# Patient Record
Sex: Female | Born: 1935 | Race: White | Hispanic: No | Smoking: Former smoker
Health system: Southern US, Community
[De-identification: ages and names within clinical notes are randomized; demographics above are authoritative.]

## PROBLEM LIST (undated history)

## (undated) DIAGNOSIS — I10 Essential (primary) hypertension: Secondary | ICD-10-CM

## (undated) DIAGNOSIS — F09 Unspecified mental disorder due to known physiological condition: Secondary | ICD-10-CM

## (undated) DIAGNOSIS — I714 Abdominal aortic aneurysm, without rupture: Secondary | ICD-10-CM

## (undated) DIAGNOSIS — F419 Anxiety disorder, unspecified: Secondary | ICD-10-CM

## (undated) DIAGNOSIS — M792 Neuralgia and neuritis, unspecified: Secondary | ICD-10-CM

## (undated) DIAGNOSIS — N390 Urinary tract infection, site not specified: Secondary | ICD-10-CM

## (undated) DIAGNOSIS — G500 Trigeminal neuralgia: Secondary | ICD-10-CM

## (undated) DIAGNOSIS — E039 Hypothyroidism, unspecified: Secondary | ICD-10-CM

## (undated) DIAGNOSIS — F1020 Alcohol dependence, uncomplicated: Secondary | ICD-10-CM

## (undated) HISTORY — DX: Hypothyroidism, unspecified: E03.9

## (undated) HISTORY — DX: Essential (primary) hypertension: I10

## (undated) HISTORY — PX: CATARACT EXTRACTION: SUR2

## (undated) HISTORY — PX: COLONOSCOPY: SHX174

## (undated) HISTORY — PX: ABDOMINAL ANGIOGRAM: SHX5705

## (undated) HISTORY — DX: Abdominal aortic aneurysm, without rupture: I71.4

## (undated) HISTORY — PX: ABDOMINAL HYSTERECTOMY: SHX81

## (undated) HISTORY — PX: BRAIN SURGERY: SHX531

## (undated) HISTORY — DX: Trigeminal neuralgia: G50.0

---

## 1998-02-08 ENCOUNTER — Other Ambulatory Visit: Admission: RE | Admit: 1998-02-08 | Discharge: 1998-02-08 | Payer: Self-pay | Admitting: *Deleted

## 1999-01-07 ENCOUNTER — Other Ambulatory Visit: Admission: RE | Admit: 1999-01-07 | Discharge: 1999-01-07 | Payer: Self-pay | Admitting: *Deleted

## 1999-11-30 ENCOUNTER — Encounter: Payer: Self-pay | Admitting: *Deleted

## 1999-11-30 ENCOUNTER — Encounter: Admission: RE | Admit: 1999-11-30 | Discharge: 1999-11-30 | Payer: Self-pay | Admitting: *Deleted

## 2000-01-16 ENCOUNTER — Other Ambulatory Visit: Admission: RE | Admit: 2000-01-16 | Discharge: 2000-01-16 | Payer: Self-pay | Admitting: *Deleted

## 2000-11-14 ENCOUNTER — Encounter: Admission: RE | Admit: 2000-11-14 | Discharge: 2000-11-14 | Payer: Self-pay

## 2000-11-23 ENCOUNTER — Encounter: Payer: Self-pay | Admitting: Vascular Surgery

## 2000-11-23 ENCOUNTER — Encounter: Admission: RE | Admit: 2000-11-23 | Discharge: 2000-11-23 | Payer: Self-pay | Admitting: Vascular Surgery

## 2000-11-30 ENCOUNTER — Encounter: Payer: Self-pay | Admitting: Vascular Surgery

## 2000-12-05 ENCOUNTER — Ambulatory Visit (HOSPITAL_COMMUNITY): Admission: RE | Admit: 2000-12-05 | Discharge: 2000-12-05 | Payer: Self-pay | Admitting: Vascular Surgery

## 2000-12-14 ENCOUNTER — Encounter: Payer: Self-pay | Admitting: Vascular Surgery

## 2000-12-14 ENCOUNTER — Inpatient Hospital Stay (HOSPITAL_COMMUNITY): Admission: RE | Admit: 2000-12-14 | Discharge: 2000-12-17 | Payer: Self-pay | Admitting: Vascular Surgery

## 2000-12-14 HISTORY — PX: ABDOMINAL AORTIC ANEURYSM REPAIR: SUR1152

## 2001-01-10 ENCOUNTER — Encounter: Payer: Self-pay | Admitting: Vascular Surgery

## 2001-01-10 ENCOUNTER — Encounter: Admission: RE | Admit: 2001-01-10 | Discharge: 2001-01-10 | Payer: Self-pay | Admitting: Vascular Surgery

## 2001-07-11 ENCOUNTER — Encounter: Payer: Self-pay | Admitting: Vascular Surgery

## 2001-07-11 ENCOUNTER — Encounter: Admission: RE | Admit: 2001-07-11 | Discharge: 2001-07-11 | Payer: Self-pay

## 2001-07-11 ENCOUNTER — Encounter: Payer: Self-pay | Admitting: *Deleted

## 2001-07-11 ENCOUNTER — Encounter: Admission: RE | Admit: 2001-07-11 | Discharge: 2001-07-11 | Payer: Self-pay | Admitting: Vascular Surgery

## 2001-12-12 ENCOUNTER — Encounter: Payer: Self-pay | Admitting: Vascular Surgery

## 2001-12-12 ENCOUNTER — Encounter: Admission: RE | Admit: 2001-12-12 | Discharge: 2001-12-12 | Payer: Self-pay | Admitting: Vascular Surgery

## 2002-07-21 ENCOUNTER — Encounter: Admission: RE | Admit: 2002-07-21 | Discharge: 2002-07-21 | Payer: Self-pay

## 2002-12-18 ENCOUNTER — Encounter: Payer: Self-pay | Admitting: Vascular Surgery

## 2002-12-18 ENCOUNTER — Encounter: Admission: RE | Admit: 2002-12-18 | Discharge: 2002-12-18 | Payer: Self-pay | Admitting: Vascular Surgery

## 2002-12-24 ENCOUNTER — Encounter: Payer: Self-pay | Admitting: Obstetrics and Gynecology

## 2002-12-24 ENCOUNTER — Encounter: Admission: RE | Admit: 2002-12-24 | Discharge: 2002-12-24 | Payer: Self-pay | Admitting: Obstetrics and Gynecology

## 2003-03-20 ENCOUNTER — Emergency Department (HOSPITAL_COMMUNITY): Admission: EM | Admit: 2003-03-20 | Discharge: 2003-03-21 | Payer: Self-pay | Admitting: Emergency Medicine

## 2003-05-28 ENCOUNTER — Encounter: Admission: RE | Admit: 2003-05-28 | Discharge: 2003-05-28 | Payer: Self-pay

## 2003-08-28 ENCOUNTER — Encounter: Admission: RE | Admit: 2003-08-28 | Discharge: 2003-08-28 | Payer: Self-pay

## 2003-12-24 ENCOUNTER — Encounter: Admission: RE | Admit: 2003-12-24 | Discharge: 2003-12-24 | Payer: Self-pay | Admitting: Vascular Surgery

## 2004-03-12 ENCOUNTER — Inpatient Hospital Stay (HOSPITAL_COMMUNITY): Admission: EM | Admit: 2004-03-12 | Discharge: 2004-03-17 | Payer: Self-pay | Admitting: Emergency Medicine

## 2004-03-16 ENCOUNTER — Encounter (INDEPENDENT_AMBULATORY_CARE_PROVIDER_SITE_OTHER): Payer: Self-pay | Admitting: *Deleted

## 2004-12-29 ENCOUNTER — Encounter: Admission: RE | Admit: 2004-12-29 | Discharge: 2004-12-29 | Payer: Self-pay | Admitting: Vascular Surgery

## 2005-01-18 ENCOUNTER — Ambulatory Visit (HOSPITAL_COMMUNITY): Admission: RE | Admit: 2005-01-18 | Discharge: 2005-01-18 | Payer: Self-pay

## 2005-08-02 ENCOUNTER — Ambulatory Visit (HOSPITAL_COMMUNITY): Admission: RE | Admit: 2005-08-02 | Discharge: 2005-08-02 | Payer: Self-pay | Admitting: Family Medicine

## 2005-08-17 ENCOUNTER — Ambulatory Visit (HOSPITAL_COMMUNITY): Admission: RE | Admit: 2005-08-17 | Discharge: 2005-08-17 | Payer: Self-pay | Admitting: Family Medicine

## 2005-09-20 IMAGING — CR DG CHEST 2V
2 series · 2 of 2 positions shown · non-contrast
Comparison: none

CLINICAL DATA: Cough.  Fatigue. 
 CHEST ? TWO VIEWS
 Heart size is normal.  The mediastinum is unremarkable except for calcification of the thoracic aorta.  The lungs are clear.  There is prominent epicardial fat pad on the left which is not a pathologic finding.  No effusions.  No significant bony findings.
 IMPRESSION
 No active disease. 
 *This report was called to Norgbedzi at  Dr. Rudis? office at [DATE], a.m., 05/28/03.

[view not recorded (1 of 2)]
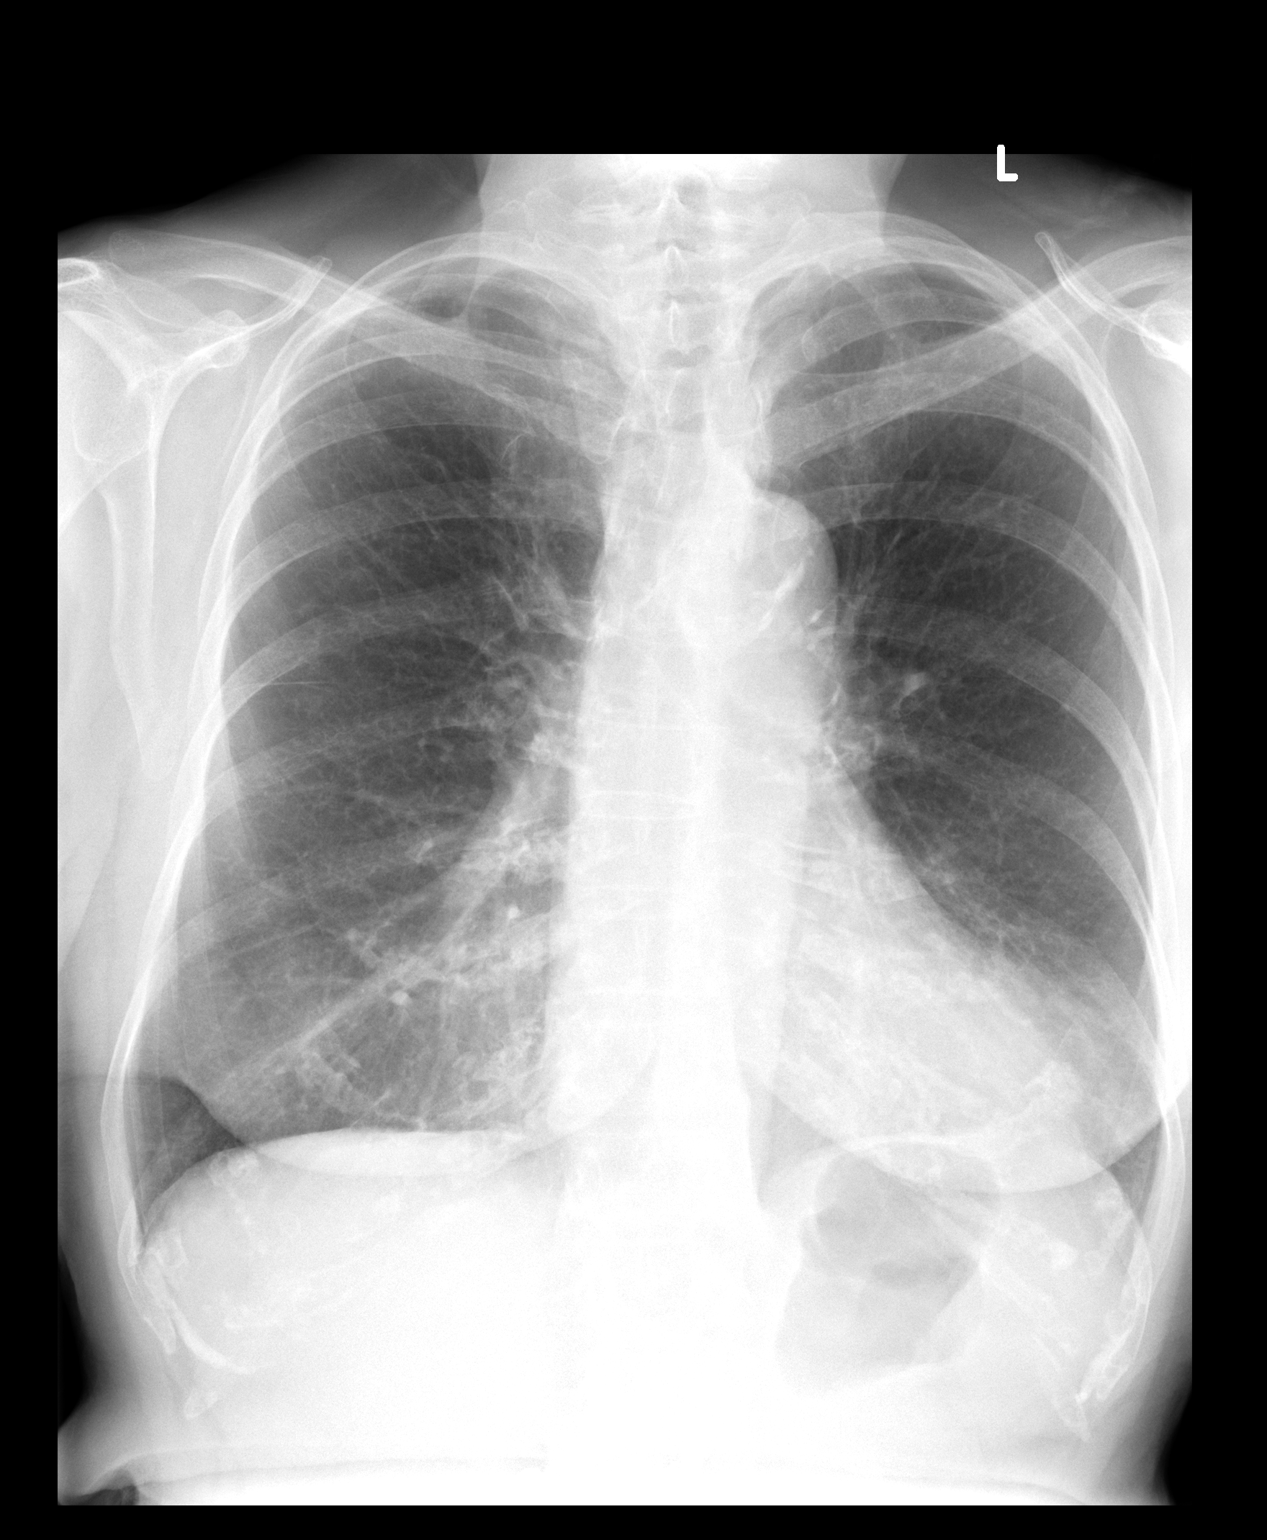

[view not recorded (2 of 2)]
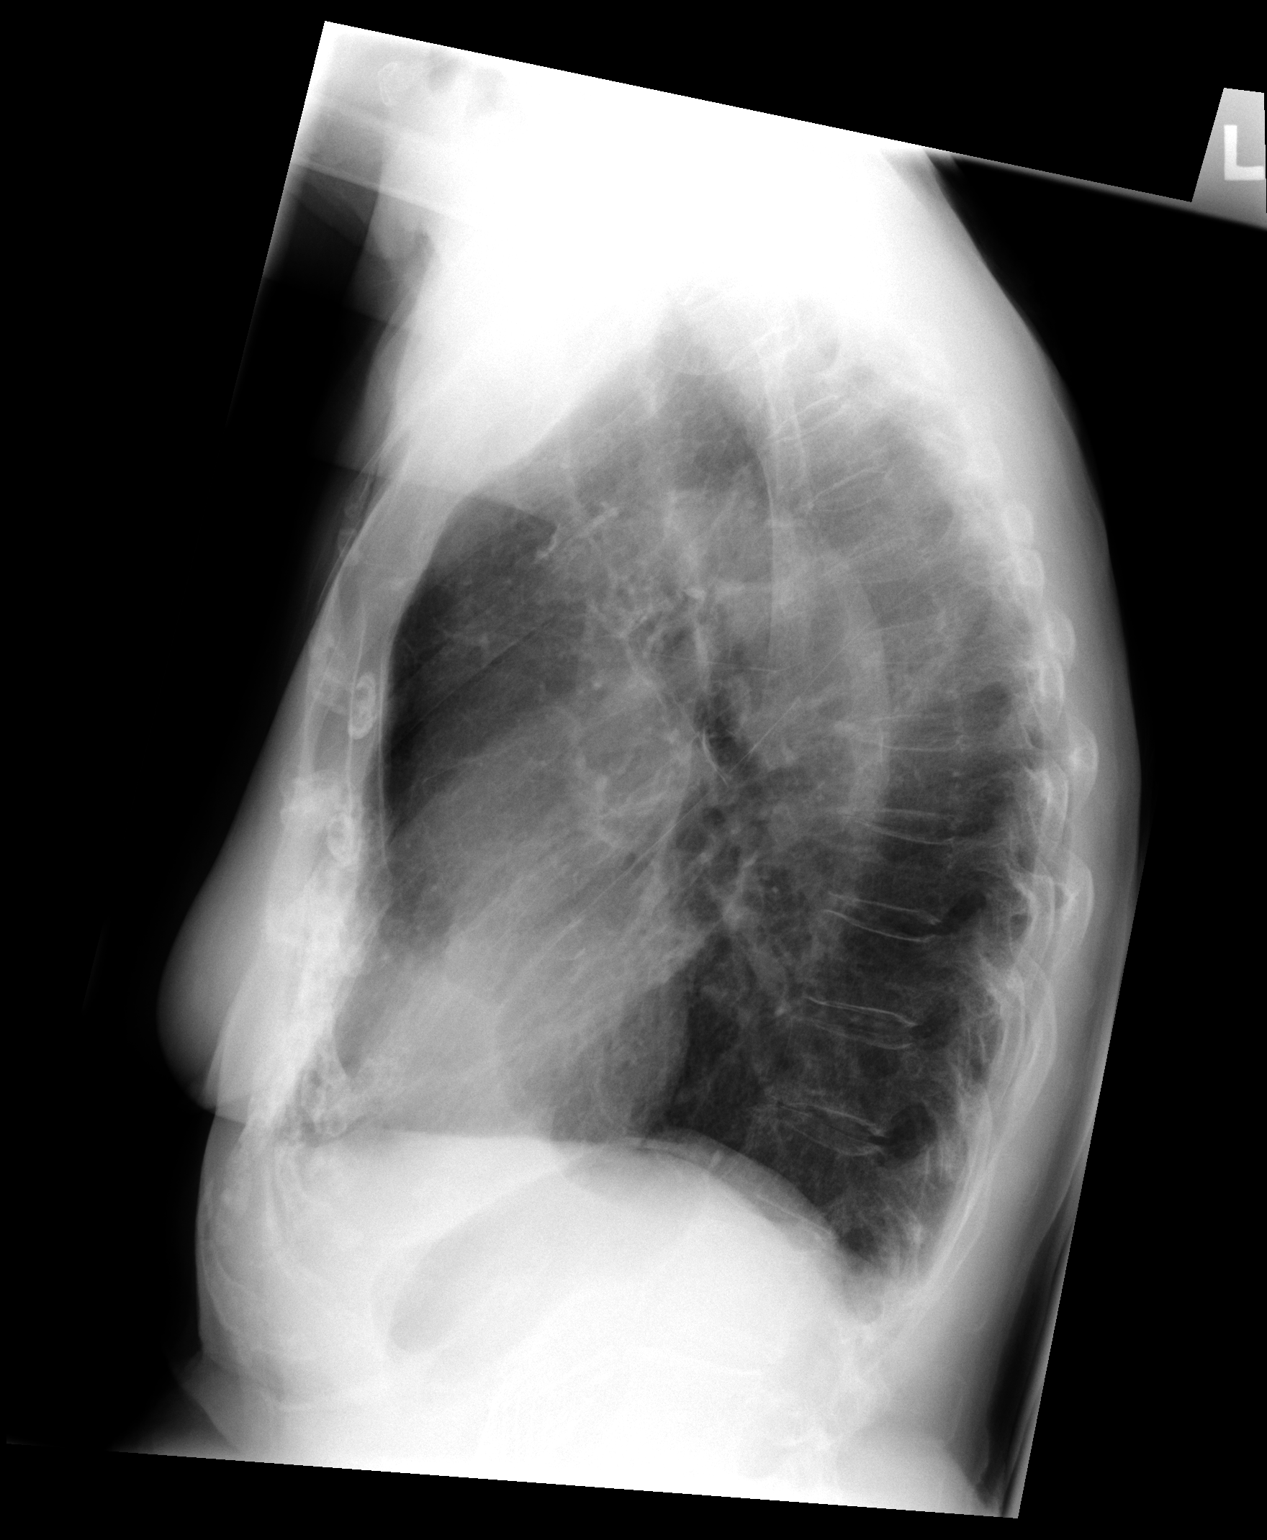

[2 of 2 positions shown; findings below may reference images not displayed]

## 2005-12-25 ENCOUNTER — Encounter: Admission: RE | Admit: 2005-12-25 | Discharge: 2005-12-25 | Payer: Self-pay | Admitting: Vascular Surgery

## 2006-03-16 ENCOUNTER — Encounter: Admission: RE | Admit: 2006-03-16 | Discharge: 2006-03-16 | Payer: Self-pay

## 2006-12-07 ENCOUNTER — Encounter: Admission: RE | Admit: 2006-12-07 | Discharge: 2006-12-07 | Payer: Self-pay | Admitting: Vascular Surgery

## 2006-12-07 ENCOUNTER — Ambulatory Visit: Payer: Self-pay | Admitting: Vascular Surgery

## 2007-06-07 ENCOUNTER — Ambulatory Visit: Payer: Self-pay | Admitting: Vascular Surgery

## 2007-06-18 ENCOUNTER — Encounter: Admission: RE | Admit: 2007-06-18 | Discharge: 2007-06-18 | Payer: Self-pay

## 2007-12-27 ENCOUNTER — Ambulatory Visit: Payer: Self-pay | Admitting: Vascular Surgery

## 2007-12-27 ENCOUNTER — Encounter: Admission: RE | Admit: 2007-12-27 | Discharge: 2007-12-27 | Payer: Self-pay | Admitting: Vascular Surgery

## 2008-06-08 ENCOUNTER — Encounter: Admission: RE | Admit: 2008-06-08 | Discharge: 2008-06-08 | Payer: Self-pay | Admitting: Family Medicine

## 2008-07-01 ENCOUNTER — Encounter: Admission: RE | Admit: 2008-07-01 | Discharge: 2008-07-01 | Payer: Self-pay | Admitting: Family Medicine

## 2008-07-31 ENCOUNTER — Encounter: Admission: RE | Admit: 2008-07-31 | Discharge: 2008-07-31 | Payer: Self-pay | Admitting: Family Medicine

## 2008-08-12 ENCOUNTER — Encounter: Admission: RE | Admit: 2008-08-12 | Discharge: 2008-08-12 | Payer: Self-pay | Admitting: Obstetrics and Gynecology

## 2008-12-21 ENCOUNTER — Encounter: Admission: RE | Admit: 2008-12-21 | Discharge: 2008-12-21 | Payer: Self-pay | Admitting: Family Medicine

## 2009-05-22 ENCOUNTER — Inpatient Hospital Stay (HOSPITAL_COMMUNITY): Admission: EM | Admit: 2009-05-22 | Discharge: 2009-05-27 | Payer: Self-pay | Admitting: Emergency Medicine

## 2009-05-24 ENCOUNTER — Encounter (INDEPENDENT_AMBULATORY_CARE_PROVIDER_SITE_OTHER): Payer: Self-pay | Admitting: Internal Medicine

## 2010-01-21 ENCOUNTER — Emergency Department (HOSPITAL_COMMUNITY): Admission: EM | Admit: 2010-01-21 | Discharge: 2010-01-21 | Payer: Self-pay | Admitting: Emergency Medicine

## 2010-02-15 ENCOUNTER — Encounter: Admission: RE | Admit: 2010-02-15 | Discharge: 2010-02-15 | Payer: Self-pay | Admitting: Vascular Surgery

## 2010-02-15 ENCOUNTER — Ambulatory Visit: Payer: Self-pay | Admitting: Vascular Surgery

## 2010-06-15 LAB — DIFFERENTIAL
Basophils Relative: 0 % (ref 0–1)
Lymphs Abs: 0.7 10*3/uL (ref 0.7–4.0)
Monocytes Absolute: 0.7 10*3/uL (ref 0.1–1.0)
Monocytes Relative: 7 % (ref 3–12)
Neutro Abs: 8.5 10*3/uL — ABNORMAL HIGH (ref 1.7–7.7)

## 2010-06-15 LAB — URINALYSIS, ROUTINE W REFLEX MICROSCOPIC
Glucose, UA: NEGATIVE mg/dL
Ketones, ur: NEGATIVE mg/dL
Nitrite: NEGATIVE
Specific Gravity, Urine: 1.009 (ref 1.005–1.030)
pH: 6.5 (ref 5.0–8.0)

## 2010-06-15 LAB — CBC
Platelets: 174 10*3/uL (ref 150–400)
RDW: 13 % (ref 11.5–15.5)
WBC: 10.1 10*3/uL (ref 4.0–10.5)

## 2010-06-15 LAB — COMPREHENSIVE METABOLIC PANEL
ALT: 17 U/L (ref 0–35)
Albumin: 3.5 g/dL (ref 3.5–5.2)
Alkaline Phosphatase: 69 U/L (ref 39–117)
BUN: 14 mg/dL (ref 6–23)
Calcium: 9.1 mg/dL (ref 8.4–10.5)
Potassium: 4.8 mEq/L (ref 3.5–5.1)
Sodium: 127 mEq/L — ABNORMAL LOW (ref 135–145)
Total Protein: 5.9 g/dL — ABNORMAL LOW (ref 6.0–8.3)

## 2010-06-15 LAB — URINE CULTURE
Colony Count: NO GROWTH
Culture  Setup Time: 201110211725

## 2010-06-23 LAB — DIFFERENTIAL
Basophils Absolute: 0 10*3/uL (ref 0.0–0.1)
Basophils Relative: 0 % (ref 0–1)
Eosinophils Absolute: 0.1 10*3/uL (ref 0.0–0.7)
Monocytes Relative: 10 % (ref 3–12)
Neutro Abs: 3.2 10*3/uL (ref 1.7–7.7)
Neutrophils Relative %: 65 % (ref 43–77)

## 2010-06-23 LAB — BASIC METABOLIC PANEL
BUN: 10 mg/dL (ref 6–23)
CO2: 25 mEq/L (ref 19–32)
Calcium: 9 mg/dL (ref 8.4–10.5)
Chloride: 99 mEq/L (ref 96–112)
Creatinine, Ser: 0.83 mg/dL (ref 0.4–1.2)
GFR calc Af Amer: >60 mL/min (ref >60)
Glucose, Bld: 72 mg/dL (ref 70–99)

## 2010-06-23 LAB — HEMOGLOBIN A1C: Hgb A1c MFr Bld: 5.6 % (ref 4.6–6.1)

## 2010-06-23 LAB — COMPREHENSIVE METABOLIC PANEL
ALT: 63 U/L — ABNORMAL HIGH (ref 0–35)
AST: 79 U/L — ABNORMAL HIGH (ref 0–37)
CO2: 21 mEq/L (ref 19–32)
Calcium: 9.1 mg/dL (ref 8.4–10.5)
GFR calc Af Amer: >60 mL/min (ref >60)
Sodium: 137 mEq/L (ref 135–145)
Total Protein: 6.4 g/dL (ref 6.0–8.3)

## 2010-06-23 LAB — CBC
HCT: 37.6 % (ref 36.0–46.0)
Hemoglobin: 13 g/dL (ref 12.0–15.0)
MCHC: 34.7 g/dL (ref 30.0–36.0)
MCHC: 34.8 g/dL (ref 30.0–36.0)
MCV: 101.6 fL — ABNORMAL HIGH (ref 78.0–100.0)
MCV: 102 fL — ABNORMAL HIGH (ref 78.0–100.0)
Platelets: 166 10*3/uL (ref 150–400)
Platelets: 169 10*3/uL (ref 150–400)
RDW: 13.5 % (ref 11.5–15.5)
RDW: 13.7 % (ref 11.5–15.5)

## 2010-06-23 LAB — LIPID PANEL
Cholesterol: 203 mg/dL — ABNORMAL HIGH (ref 0–200)
HDL: 106 mg/dL (ref >39)
LDL Cholesterol: 86 mg/dL (ref 0–99)
Total CHOL/HDL Ratio: 1.9 RATIO
Triglycerides: 56 mg/dL (ref <150)

## 2010-06-23 LAB — URINALYSIS, ROUTINE W REFLEX MICROSCOPIC
Bilirubin Urine: NEGATIVE
Glucose, UA: NEGATIVE mg/dL
Protein, ur: NEGATIVE mg/dL
Specific Gravity, Urine: 1.005 (ref 1.005–1.030)
Urobilinogen, UA: 0.2 mg/dL (ref 0.0–1.0)

## 2010-06-23 LAB — URINE MICROSCOPIC-ADD ON

## 2010-06-23 LAB — ETHANOL: Alcohol, Ethyl (B): 197 mg/dL — ABNORMAL HIGH (ref 0–10)

## 2010-06-23 LAB — GLUCOSE, CAPILLARY
Glucose-Capillary: 181 mg/dL — ABNORMAL HIGH (ref 70–99)
Glucose-Capillary: 78 mg/dL (ref 70–99)

## 2010-06-23 LAB — FOLATE: Folate: 7 ng/mL

## 2010-06-23 LAB — CARDIAC PANEL(CRET KIN+CKTOT+MB+TROPI)
Relative Index: INVALID (ref 0.0–2.5)
Total CK: 52 U/L (ref 7–177)
Troponin I: 0.01 ng/mL (ref 0.00–0.06)

## 2010-06-23 LAB — APTT: aPTT: 28 seconds (ref 24–37)

## 2010-06-23 LAB — TROPONIN I: Troponin I: 0.02 ng/mL (ref 0.00–0.06)

## 2010-06-23 LAB — ANA: Anti Nuclear Antibody(ANA): NEGATIVE

## 2010-06-23 LAB — PROTIME-INR: INR: 0.94 (ref 0.00–1.49)

## 2010-06-23 LAB — RPR: RPR Ser Ql: NONREACTIVE

## 2010-08-16 NOTE — Assessment & Plan Note (Signed)
OFFICE VISIT   Tamargo, Jessee C  DOB:  Jan 07, 1917                                       12/07/2006  NWGNF#:[id number]   The patient returns today for follow-up of her AneuRx Stent graft  placement in September, 2002.  She continues to do quite well and is  quite active at her age of 60.  She reports no new medical complications  and no concerns regarding her stent graft.   PHYSICAL EXAM TODAY:  Blood pressure is 143/81, pulse 89, respirations  18.  Her femoral pulses are 2+ without false aneurysm.  Her abdominal  exam reveals slightly prominent aortic pulsations.  She is quite thin  with no tenderness.   She underwent a CT scan, and this reveals excellent positioning of her  AneuRx Stent Graft with continued shrinkage of her aneurysm sac.  She  has no evidence of shifting and no evidence of endo leak.  She did have  an incidental finding of a 1-cm 1.2 soft tissue nodule in the right side  of the labia.  I discussed this with the patient and her daughter, who  reports that she has an appointment with Dr. Jackelyn Knife in November and  will address this at this time.  We will continue to see her in 1 year  with a CT scan.  She will also have an ultrasound of her stent graft in  6 months from now.  I explained that, with her having an excellent 5-  year result, after we  compare her ultrasound with her CT scan, we may go back to less frequent  CT scans and follow up with ultrasound alone.   Larina Earthly, M.D.  Electronically Signed   TFE/MEDQ  D:  12/07/2006  T:  12/10/2006  Job:  [id number]   cc:   Kizzie Furnish, M.D.  Zenaida Niece, M.D.

## 2010-08-16 NOTE — Assessment & Plan Note (Signed)
OFFICE VISIT   [name], [name] C  DOB:  21-Sep-1916                                       12/27/2007  ZOXWR#:[id number]   The patient presents today for followup of her stent graft repair of  abdominal aortic aneurysm with an Anurx graft in 2002.  She has had her  CT scan this morning and we are discussing this.  She continues to have  excellent health at the age of 73.  She is treated for elevation  hypertension.  Otherwise, no major medical difficulties.   PHYSICAL EXAM:  She is a thin white female in no acute distress.  Blood  pressure is 158/82, pulse 114, respirations 18.  Her femoral and  posterior tibial pulses are 2+ bilaterally.  Abdominal exam reveals no  aneurysm palpable.   CT scan today shows continued shrinking of her aneurysm sac to the point  where there is minimal sac around her stent graft.  The repair continues  to be excellent with no shift and no endoleak.  I am quite pleased with  her continued followup now 7 years out from stent graft repair.  We will  see her in 1 year with ultrasound of her stent graft repair, and then  follow this 2 years from now with CT scan.   Larina Earthly, M.D.  Electronically Signed   TFE/MEDQ  D:  12/27/2007  T:  12/30/2007  Job:  [id number]

## 2010-08-16 NOTE — Assessment & Plan Note (Signed)
OFFICE VISIT   Mcsweeney, Adream C  DOB:  1916-12-28                                       02/15/2010  ZOXWR#:[id number]   The patient presents today for follow-up of her abdominal aortic  aneurysm repair.  She had a stent graft repair of early on in the  experience of stent grafting on December 14, 2000.  She has done quite  well over the 9 years since this procedure.  She has 89 but is still  relatively independent although she has moved into an assisted living  facility.  Her daughter is in town and helps her on a daily basis.  She  denies any cardiac difficulty.  She does have hypertension, elevated  cholesterol.  She does report some night cramping when she walks.  She  does not smoke and does have one glass of wine per evening.   PHYSICAL EXAMINATION:  A well-developed, thin white female appearing  younger than stated age of 45.  Blood pressure is 128/72, pulse 111,  respirations 16.  She is in no acute distress.  HEENT:  Normal.  Chest:  Clear bilaterally.  Abdomen:  Soft, nontender.  No palpable masses and  no aneurysm palpable.  Cardiovascular:  Regular rate and rhythm.  Musculoskeletal:  No major deformity or cyanosis.  She does have well-  healed incisions with 2+ femoral pulses bilaterally.   She underwent CT scan today and I have reviewed this with the patient  and her daughter present,  independently reviewed the pictures.  This  does show good positioning of her stent graft with no evidence of  endoleak.  Her sac size has continued to diminished as well.  I am quite  pleased with her result and recommend continued CT scan on an every 2-  year basis.  We will see her again at that time.     Larina Earthly, M.D.  Electronically Signed   TFE/MEDQ  D:  02/15/2010  T:  02/16/2010  Job:  [id number]   cc:   Dr. [name]

## 2010-08-16 NOTE — Procedures (Signed)
ENDOVASCULAR STENT GRAFT EXAM   INDICATION:  Follow up abdominal aortic aneurysm repair.   HISTORY:  Abdominal aortic aneurysm repair 12/14/2000 with endovascular stent  graft by Dr. Arbie Cookey.                           DUPLEX EVALUATION   AAA Sac Size:                 2.85 CM AP            3.55 CM TRV  Previous Sac Size:            (CT 12/2006) 4 CM AP                 2.5  CM TRV  Evidence of an endoleak?      No                    No   Velocity Criteria:  Proximal Aorta                49 cm/sec  Proximal Stent Graft          47 cm/sec  Main Body Stent Graft-Mid     61 cm/sec  Right Limb-Proximal           66 cm/sec  Right Limb-Distal             50 cm/sec  Left Limb-Proximal            57 cm/sec  Left Limb-Distal              59 cm/sec  Patent Renal Arteries?        Yes                   Yes   IMPRESSION:  1. Residual abdominal aortic aneurysm sac size measurements appear      fairly stable from previous CT, taking into consideration different      modalities without evidence of endoleak.  2. Flow within stent graft appears within normal limit.   ___________________________________________  Larina Earthly, M.D.   AS/MEDQ  D:  06/07/2007  T:  06/07/2007  Job:  [id number]

## 2010-08-19 NOTE — Op Note (Signed)
[name] [name], [name] [name]               ACCOUNT NO.:  [id number]   MEDICAL RECORD NO.:  [id number]          PATIENT TYPE:  INP   LOCATION:  1610                         FACILITY:  [facility]   PHYSICIAN:  Petra Kuba, M.D.    DATE OF BIRTH:  1916/04/15   DATE OF PROCEDURE:  03/16/2004  DATE OF DISCHARGE:                                 OPERATIVE REPORT   PROCEDURE:  Flexible sigmoidoscopy with biopsy.   INDICATIONS:  Probable ischemic colitis, abnormal CAT scan, bloody diarrhea.   Consent was signed after risks, benefits, methods, options thoroughly  discussed multiple times this week.   MEDICINES USED:  Demerol 70, Versed 7.   PROCEDURE:  Rectal inspection is pertinent for external hemorrhoids, small.  Digital exam was negative.  The video pediatric adjustable colonoscope was  inserted to about 40 cm.  At that point, the scope began to loop, and  despite abdominal pressure, she exhibited pain with advancing, so we rolled  her on her back and with abdominal pressure, we could advance through the  proximal level of the splenic flexure.  Obvious changes of ischemic colitis  were seen from about the sigmoid/descending junctions of the splenic  flexure.  There was a tortuous turn which we could see around.  It seemed to  be normal transverse in the distance but advancing through it was difficult  due to looping, pain, and discomfort.  We elected to withdraw.  A few  scattered left-sided biopsies were obtained.  There were a few sigmoid  diverticula as well but no other abnormalities.  The prep was adequate.  There was some liquid stool that required washing and suctioning.  Anorectal  pull-through and retroflexion confirmed some small hemorrhoids.  The scope  was straightened, air was suctioned, and the scope removed.  The patient  tolerated the procedure adequately.  There was no obvious immediate  complication.   ENDOSCOPIC DIAGNOSES:  1.  Internal/external hemorrhoids.  2.  Left-sided  diverticula.  3.  Obvious ischemic changes from the sigmoid/descending junction to the      splenic flexure, status post biopsy.  4.  Stop secondary to pain, tortuosity, and looping.   PLAN:  Await pathology.  Considering rechecking her colon in the future.  Slowly advance diet.  Hopefully home soon if no delayed complications.      MEM/MEDQ  D:  03/16/2004  T:  03/16/2004  Job:  [id number]   cc:   Kizzie Furnish, M.D.  [address]  [place]  [place] [address]  Fax: [phone]

## 2010-08-19 NOTE — H&P (Signed)
NAMECARLOTTA, TELFAIR                           ACCOUNT NO.:  [id number]   MEDICAL RECORD NO.:  [id number]                   PATIENT TYPE:  OBV   LOCATION:  0106                                 FACILITY:  [facility]   PHYSICIAN:  Melissa L. Ladona Ridgel, MD               DATE OF BIRTH:  November 14, 1916   DATE OF ADMISSION:  03/20/2003  DATE OF DISCHARGE:                                HISTORY & PHYSICAL   PRIMARY CARE PHYSICIAN:  Dr. Andres Ege   CHIEF COMPLAINT:  Weakness x1 week.   HISTORY OF PRESENT ILLNESS:  Patient is an 77 year old white female who  noticed the gradual onset of generalized weakness over the last week and one-  half.  On Monday the patient went to her primary care physician with the  complaint of weakness and was found to have a sodium of 128.  She was re-  evaluated today when she returned to her primary care physician because she  was more weak than she had been on the first presentation.  She states that  she was so weak that she stayed in bed all day Wednesday.  She denied any  associated nausea, vomiting, fever, chills, or diarrhea.   REVIEW OF SYSTEMS:  Positive for cough several weeks, productive of white  sputum.  She denies any dysuria, diarrhea, or constipation.  She has no  nausea, no hematochezia or melena.  All other review of systems are  negative.   PAST MEDICAL HISTORY:  1. She has a recent murmur identified on echo.  2. Hypertension.  3. Ovarian cancer treated many years ago with cobalt therapy.   PAST SURGICAL HISTORY:  For aneurysms for which she has had stents placed.  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   MEDICATIONS:  1. Synthroid 75 mcg p.o. daily.  2. Hydrochlorothiazide 25 mg p.o. daily.  3. Multivitamin.  4. Vitamin E.  5. Vitamin C.  6. Vitamin B12.  7. Vitamin B6.  8. Folic acid.  9. Zinc.   PHYSICAL EXAMINATION:  Her blood pressure was stable  and over 100  systolically.  Respirations were 16.  She was saturating 100% on room air.  GENERAL:  This is an awake, alert and oriented x3 white female looking  younger than her stated age.  HEENT:  She is normocephalic, atraumatic.  Pupils are equal, round and  reactive to light.  Extraocular muscles are intact.  Mucous membranes are  moist.  There is a small patch of hairy tongue visible on oral examination.  NECK:  Supple.  There is no JVD.  There are no lymph nodes.  No thyromegaly.  CHEST:  Clear to auscultation.  There is no rhonchi, rales or wheezes.  CARDIOVASCULAR:  Regular rate and rhythm both of S1 and S2.  There is a 2/6  systolic ejection murmur.  ABDOMEN:  Soft, nontender, nondistended with positive bowel sounds.  There  is no hepatosplenomegaly.  EXTREMITIES:  There is no cyanosis, clubbing or edema.  NEUROLOGIC:  Cranial nerves II-XII are intact.  Power 5/5 in all  extremities.  Deep tendon reflexes are 2+ in all extremities.  She has gross  sensation.  She is nonfocal overall.   Laboratory values reveal a negative UA.  Total CK of 105 with a troponin of  less than 0.1.  Her white count is 8.0, her hemoglobin is 13.6 with a  hematocrit of 39.3, and her platelet count is 330.  Her sodium is 122, her  potassium is 4.9, her chloride is 91, and her CO2 is 27, BUN of 18 and  creatinine of 0.8, her glucose is 132.  Her AST is 35, her ALT is 35.   Her chest x-ray reveals loss of the left costophrenic angle consistent with  possible pneumonia versus atelectasis.   EKG is normal sinus rhythm, no ST-T wave changes.   ASSESSMENT/PLAN:  This is an 77 year old white female who was admitted for  weakness and found to have a decreased sodium and possible a left lower lobe  pneumonia that is mild in nature.   1. We would gently replete this patient with 75 mL per hour after the     initial bolus delivered in the emergency room.  We have started     moxifloxacin for bronchitis  versus early pneumonia.  Orthostatics will be     rechecked prior to her discharge.  2. Endocrine.  We will continue with her Synthroid.  3. Pulmonary.  There are no issues.  4. Gastrointestinal.  There are no issues.  However, she can advance to a     cardiac prudent diet at this time.                                               Melissa L. Ladona Ridgel, MD    MLT/MEDQ  D:  03/21/2003  T:  03/21/2003  Job:  [id number]

## 2010-08-19 NOTE — Discharge Summary (Signed)
NAMETYGER, OKA               ACCOUNT NO.:  [id number]   MEDICAL RECORD NO.:  [id number]          PATIENT TYPE:  INP   LOCATION:  7846                         [facility]ACILITY:  [facility]   PHYSICIAN:  Petra Kuba, M.D.    DATE OF BIRTH:  May 18, 1916   DATE OF ADMISSION:  03/12/2004  DATE OF DISCHARGE:  03/17/2004                                 DISCHARGE SUMMARY   PROBLEM LIST:  1.  Ischemic colitis, improved.  2.  History of peripheral vascular disease and abdominal aortic stents by      Dr. Arbie Cookey.  3.  History of ovarian cancer, status post total abdominal hysterectomy,      salpingo-oophorectomy.  4.  High blood pressure.  5.  Thyroid problems.   HISTORY:  The patient was admitted by my partner, Dr. Randa Evens, on the 10th  with ischemic colitis with bloody diarrhea, nausea, vomiting, fever,  abdominal pain.  CAT scan confirmed the diagnosis.  She was kept NPO and  even needed an NG tube for 24 hours.  She was started on Cipro and Flagyl.  Her white count peaked at 14 but then decreased to normal, and she only had  1 day of fever.  Her belly pain slowly improved.  On the 14th, we went ahead  and proceeded with a flexible sigmoidoscopy which confirmed the diagnosis,  was well-tolerated, and a few biopsies were obtained.  The patient's diet  was then slowly advanced, and on the 15th, she was deemed ready for  discharge.  There were no hospital complications or problems.  Her activity  was slowly advanced as well.   DISCHARGE INSTRUCTIONS:  1.  Resume home medicines.  2.  Pain management:  Not applicable.  3.  Activity:  Slowly advance as tolerated.  4.  Diet:  One week of low residue, no fried, spicy, nuts, popcorn,      __________ vegetables, salads, etc.   SPECIAL INSTRUCTIONS:  Call if increased pain, bleeding, fever, nausea,  vomiting, or other questions or problems.  Follow up with me in 1-2 weeks to  recheck symptoms.  Consider a one-time colonoscopy in the spring, and call  for biopsy results Monday or Tuesday just to confirm the diagnosis.  Also,  based on being followed by Dr. Arbie Cookey for peripheral vascular disease, I  thought it would be reasonable to see him in January but the daughter, who  is  a nurse here, may just call Dr. Arbie Cookey and go over the case and see if follow  up is truly necessary.  She does see him once a year in September.   CONDITION ON DISCHARGE:  Improved.      MEM/MEDQ  D:  03/17/2004  T:  03/17/2004  Job:  [id number]   cc:   Delories Heinz, MD  [facility]   Larina Earthly, M.D.  [address]  [place]  [place] [address]

## 2010-08-19 NOTE — H&P (Signed)
[name] [name], [name]               ACCOUNT NO.:  [id number]   MEDICAL RECORD NO.:  [id number]          PATIENT TYPE:  EMS   LOCATION:  ED                           FACILITY:  [facility]   PHYSICIAN:  James L. Malon Kindle., M.D.DATE OF BIRTH:  1916-09-23   DATE OF ADMISSION:  03/12/2004  DATE OF DISCHARGE:                                HISTORY & PHYSICAL   REASON FOR ADMISSION:  Abdominal pain and GI bleeding.   HISTORY:  An 77 year old patient from [place], mother of [name] [name], who was out shopping with [name] [name] today down in [place], felt fine, was  hungry, went to get something to eat, did not have any problems.  Later on,  she felt like she needed to have a bowel movement, went to [facility], had a  brown semi-formed loose stool, and felt weak and dizzy and nearly passed.  Had several episodes of syncope; [name] [name] caught her.  She had no bleeding at  that time.  She was taken to the hospital.  At the hospital, she was  slightly diaphoretic, and had a systolic pressure in the 90s.  It came right  up with fluids.  I believe the initial pressure was the one found by EMS  when then picked her up from the restaurant.  I have discussed with the ER  doctor.  The patient had several bloody bowel movements that were bright red  blood, cranberry-like stools in the emergency room, that were grossly guaiac  positive.  An NG tube was placed, and the aspirate was green and heme  negative.  The patient clinically feels better.  Her pressure has come back  up.  Her family requested that she be hospitalized here in [place] rather  than in [place], due to support structures, etc.   CURRENT MEDICATIONS:  1.  Altace 5 mg daily.  2.  Some other sort of blood pressure pill.  3.  Synthroid, dose unknown.  4.  Occasional aspirin.   ALLERGIES:  1.  PENICILLIN.  2.  DILANTIN.  3.  TEGRETOL.   MEDICAL HISTORY:  1.  She has a history of tic douloureux.  She has had a surgery for this at      [facility], and something called a gamma knife at [facility].  It has been      a long-term problem for her and recurs periodically.  2.  Hypertension.  3.  History of low sodium with hydrochlorothiazide.  4.  History of peripheral vascular disease.  Has had stent placed in her      abdominal aorta by Dr. Gretta Began.  Has never had an intra-abdominal      graft placed.  5.  She has a history of ovarian cancer, and underwent a total abdominal      hysterectomy and salpingo-oophorectomy in 1960.  6.  She has a history of previous appendicitis.  7.  Previous tonsillectomy.  8.  No history of heart disease.   FAMILY HISTORY:  Father died in his 62s of natural causes.  Mother died in  her 62s of multiple causes.  Had no cancer or heart disease.  Did have ulcer  disease.   SOCIAL HISTORY:  The patient is widowed and lives alone.  Daughter is Cherril Hett, who is a nurse here at [facility] [employer].  She has  family close by.  She drinks one vodka tonic a night.  She does not smoke,  and has not for 30 years, although she did smoke prior to that.   REVIEW OF SYSTEMS:  Her bowel movement is normally brown and formed.  She  had never had diverticular disease, never had a colonoscopy.  No history of  heart disease, exertional chest pain, etc.   PHYSICAL EXAMINATION:  VITAL SIGNS:  Temperature 97.2, blood pressure  133/49, pulse 72.  GENERAL:  Alert white female in no acute distress.  HEENT:  Eyes - sclerae are nonicteric.  Ears - tympanic membranes are both  seen.  There is not much cerumen in the ear canals.  There is good light  reflex.  Throat is normal.  The mucous membranes are noted to be moist.  NECK:  Supple.  No lymphadenopathy.  LUNGS:  Clear anteriorly and posteriorly.  HEART:  Regular rate and rhythm without murmurs or gallops.  ABDOMEN:  Soft, nondistended, with markedly increased bowel sounds, with  mild tenderness, left somewhat greater than the right.  No guarding,   rigidity, or rebound.  RECTAL:  Deferred.  Cranberry stools were observed by the ER doctor in  [place].   ASSESSMENT:  Acute lower gastrointestinal bleed and abdominal pain.  This  most likely is due to ischemic colitis, given the sudden onset of this and  the pain.  The patient seems to be clinically stable now.  There are no  signs of upper gastrointestinal bleed.  Diverticular bleed is possible.   PLAN:  Will admit, give IV fluids, follow her clinically, and plan on  colonoscopy in the next several days.  Will go ahead and keep her NG tube in  for now, and try to give her some laxatives to clear her out.      JLE/MEDQ  D:  03/12/2004  T:  03/13/2004  Job:  [id number]   cc:   Kizzie Furnish, M.D.  [address]  [place]  [place] [address]  Fax: [phone]

## 2010-10-02 IMAGING — CR DG FOOT COMPLETE 3+V*R*
3 series · 3 of 3 positions shown · non-contrast
Comparison: None

CLINICAL DATA: Fall with lateral foot pain.

RIGHT FOOT COMPLETE - 3+ VIEW

[view not recorded (1 of 3)]
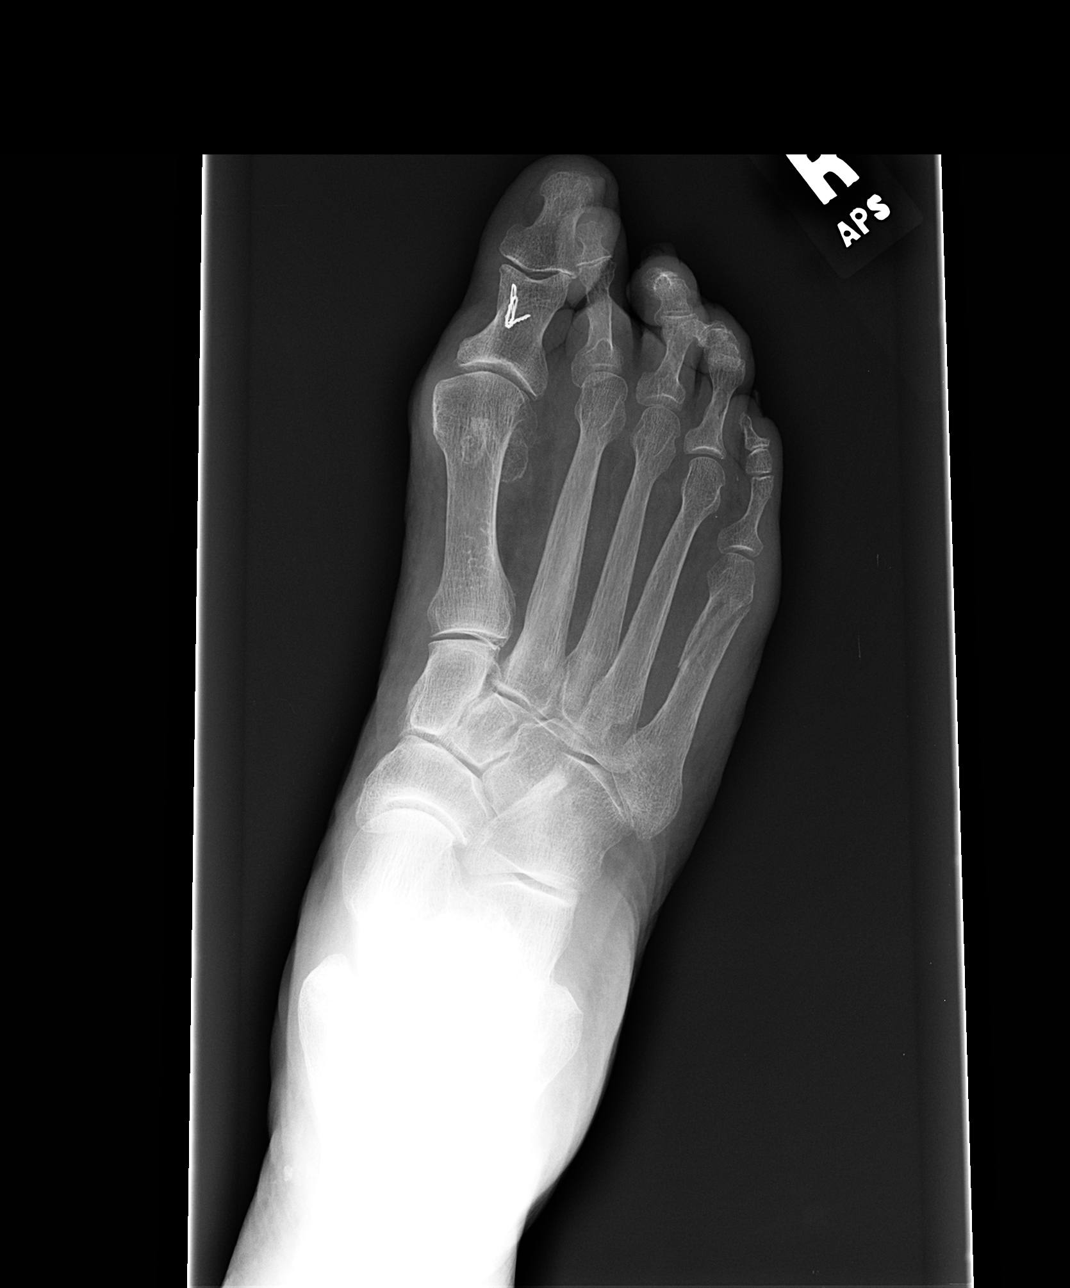

[view not recorded (2 of 3)]
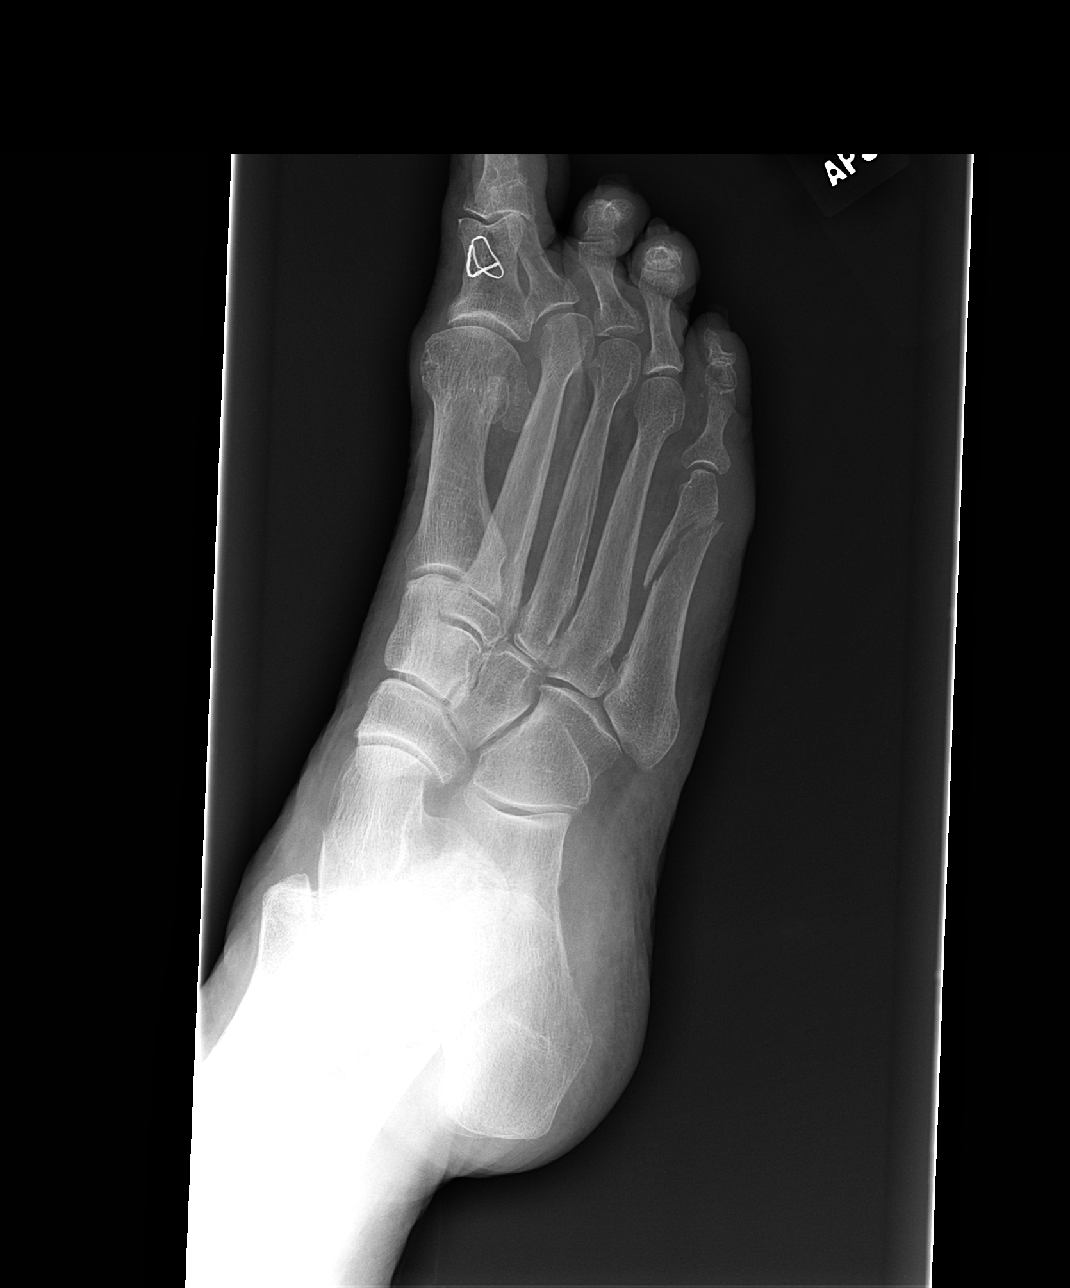

[view not recorded (3 of 3)]
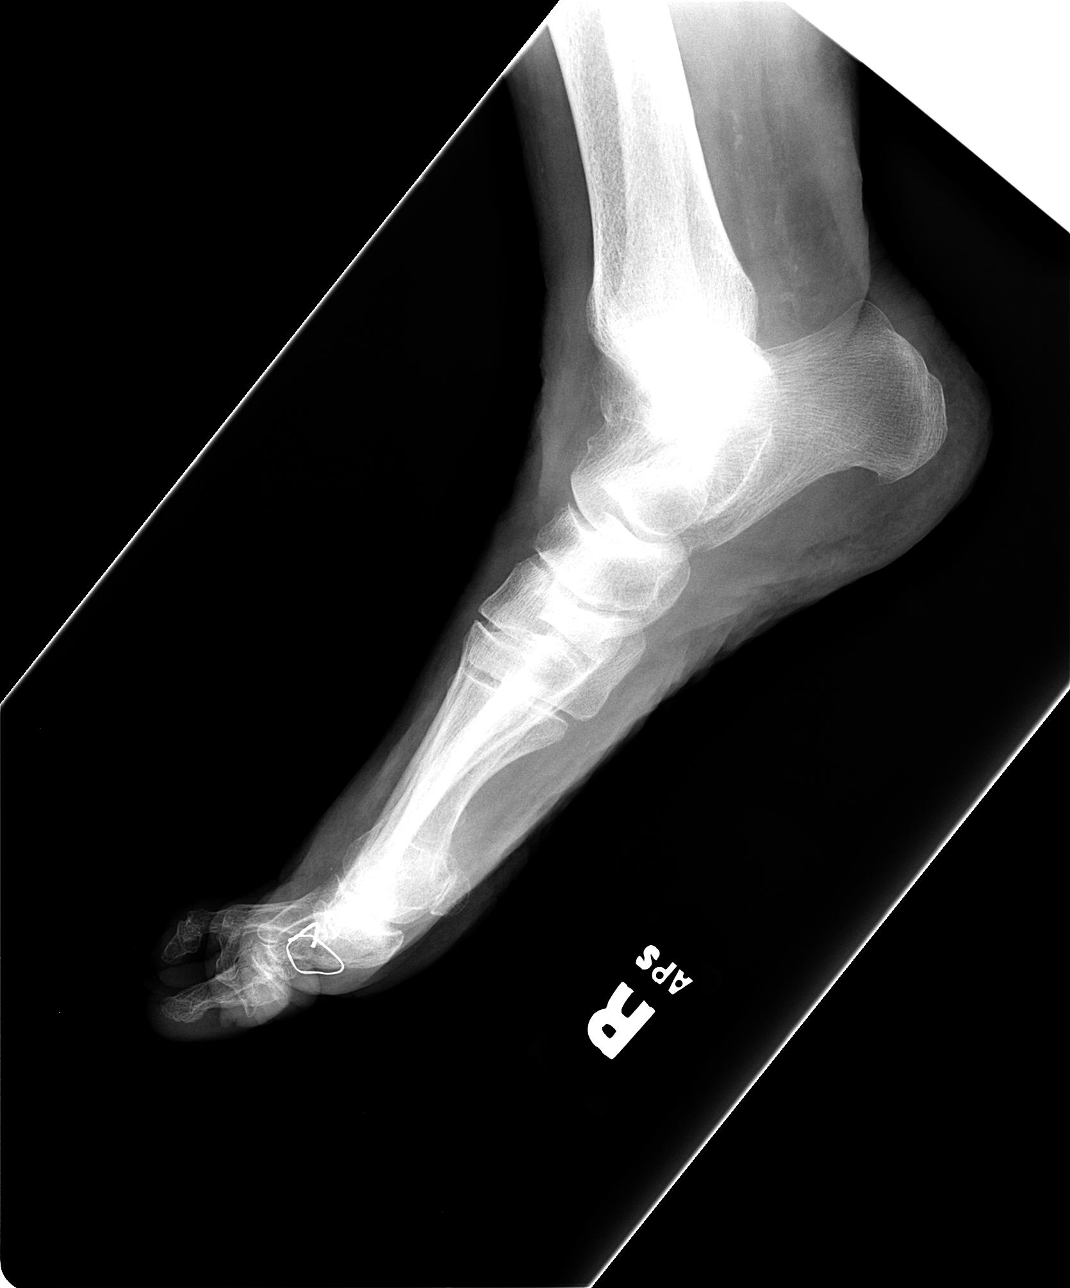

[3 of 3 positions shown; findings below may reference images not displayed]

FINDINGS: There is a mildly displaced and obliquely oriented
fracture of the distal fifth metatarsal shaft.  It does not appear
to extend into the fifth metatarsal phalangeal joint.  Associated
soft tissue swelling is noted.  There are postoperative changes in
the first proximal phalanx with degenerative changes of the
proximal phalangeal joint.
IMPRESSION: 1.  Mildly displaced fifth metatarsal shaft fracture.
2.  First metatarsal phalangeal joint osteoarthritis and hallux
valgus.

## 2011-11-13 ENCOUNTER — Other Ambulatory Visit: Payer: Self-pay | Admitting: *Deleted

## 2011-11-13 DIAGNOSIS — I714 Abdominal aortic aneurysm, without rupture: Secondary | ICD-10-CM

## 2011-11-13 DIAGNOSIS — Z48812 Encounter for surgical aftercare following surgery on the circulatory system: Secondary | ICD-10-CM

## 2012-02-09 ENCOUNTER — Encounter: Payer: Self-pay | Admitting: Vascular Surgery

## 2012-02-14 ENCOUNTER — Other Ambulatory Visit: Payer: Self-pay | Admitting: Vascular Surgery

## 2012-02-19 ENCOUNTER — Encounter: Payer: Self-pay | Admitting: Vascular Surgery

## 2012-02-20 ENCOUNTER — Ambulatory Visit
Admission: RE | Admit: 2012-02-20 | Discharge: 2012-02-20 | Disposition: A | Discharge status: Home / Self Care | Payer: Medicare Other | Source: Ambulatory Visit | Attending: Vascular Surgery | Admitting: Vascular Surgery

## 2012-02-20 ENCOUNTER — Encounter: Payer: Self-pay | Admitting: Vascular Surgery

## 2012-02-20 ENCOUNTER — Ambulatory Visit (INDEPENDENT_AMBULATORY_CARE_PROVIDER_SITE_OTHER): Payer: Medicare Other | Admitting: Vascular Surgery

## 2012-02-20 VITALS — BP 163/66 | HR 96 | Ht 62.0 in | Wt 118.0 lb

## 2012-02-20 DIAGNOSIS — I714 Abdominal aortic aneurysm, without rupture: Secondary | ICD-10-CM

## 2012-02-20 DIAGNOSIS — Z48812 Encounter for surgical aftercare following surgery on the circulatory system: Secondary | ICD-10-CM

## 2012-02-20 MED ORDER — IOHEXOL 350 MG/ML SOLN
80.0000 mL | Freq: Once | INTRAVENOUS | Status: AC | PRN
  Administered 2012-02-20: 80 mL via INTRAVENOUS

## 2012-02-20 NOTE — Progress Notes (Signed)
The patient presents today for followup of her stent graft repair of abdominal aortic aneurysm by myself in September of 2002. She continues to do quite well despite her advanced age of 75. She lives in an assisted living facility. 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 She is here today with her daughter. She has no symptoms referable to her aneurysm.  Past Medical History  Diagnosis Date  . AAA (abdominal aortic aneurysm)   . Hypertension   . Hypothyroidism   . Hypothyroidism   . Trigeminal neuralgia     History  Substance Use Topics  . Smoking status: Former Smoker    Types: Cigarettes    Quit date: 02/19/1962  . Smokeless tobacco: Never Used  . Alcohol Use: 4.2 oz/week    7 Glasses of wine per week    Family History  Problem Relation Age of Onset  . Other Brother     coronary artery disaease    Allergies  Allergen Reactions  . Dilantin (Phenytoin Sodium Extended)   . Penicillins   . Tegretol (Carbamazepine)     Current outpatient prescriptions:alendronate (FOSAMAX) 35 MG tablet, Take 35 mg by mouth every 7 (seven) days. Take with a full glass of water on an empty stomach., Disp: , Rfl: ;  aspirin 81 MG tablet, Take 81 mg by mouth daily., Disp: , Rfl: ;  calcium-vitamin D (OSCAL WITH D) 500-200 MG-UNIT per tablet, Take 1 tablet by mouth daily., Disp: , Rfl: ;  cholecalciferol (VITAMIN D) 1000 UNITS tablet, Take 1,000 Units by mouth daily., Disp: , Rfl:  donepezil (ARICEPT) 10 MG tablet, Take 10 mg by mouth at bedtime as needed., Disp: , Rfl: ;  fish oil-omega-3 fatty acids 1000 MG capsule, Take 1 g by mouth daily., Disp: , Rfl: ;  folic acid (FOLVITE) 1 MG tablet, Take 1 mg by mouth daily., Disp: , Rfl: ;  levothyroxine (SYNTHROID, LEVOTHROID) 88 MCG tablet, Take 88 mcg by mouth daily., Disp: , Rfl: ;  lisinopril (PRINIVIL,ZESTRIL) 40 MG tablet, Take 40 mg by mouth daily., Disp: , Rfl:  LORazepam (ATIVAN) 0.5 MG tablet, Take 0.5 mg by mouth every 8  (eight) hours., Disp: , Rfl: ;  thiamine (VITAMIN B-1) 100 MG tablet, Take 100 mg by mouth daily., Disp: , Rfl: ;  triamterene-hydrochlorothiazide (MAXZIDE-25) 37.5-25 MG per tablet, Take 1 tablet by mouth daily., Disp: , Rfl: ;  vitamin B-12 (CYANOCOBALAMIN) 1000 MCG tablet, Take 1,000 mcg by mouth daily., Disp: , Rfl:  No current facility-administered medications for this visit. Facility-Administered Medications Ordered in Other Visits: [COMPLETED] iohexol (OMNIPAQUE) 350 MG/ML injection 80 mL, 80 mL, Intravenous, Once PRN, Medication Radiologist, MD, 80 mL at 02/20/12 1225  BP 163/66  Pulse 96  Ht 5\' 2"  (1.575 m)  Wt 118 lb (53.524 kg)  BMI 21.58 kg/m2  SpO2 99%  Body mass index is 21.58 kg/(m^2).       Physical exam: Well-developed well-nourished white female in no acute distress Neurologically she is grossly intact Pulse status 2+ radial pulses and 2+ femoral pulses bilaterally Abdominal exam reveals no tenderness no masses and no palpable aneurysm Skin without ulcers or rashes  CT scan today was reviewed with the patient and her daughter. This shows excellent positioning of her stent graft with no evidence of migration and no evidence of endoleak. Her nadir sac is diminished down to approximately 3.8 cm.  Impression and plan: Stable followup status post stent graft aneurysm repair 11 years ago.  With her long-term durability in her advanced age and feels completely appropriate to discontinue CT followup. The patient and her daughter are comfortable with this decision. She will see [place] again on an as-needed basis

## 2013-01-07 ENCOUNTER — Encounter (HOSPITAL_COMMUNITY): Payer: Self-pay | Admitting: Emergency Medicine

## 2013-01-07 ENCOUNTER — Emergency Department (HOSPITAL_COMMUNITY): Payer: Medicare Other

## 2013-01-07 ENCOUNTER — Emergency Department (HOSPITAL_COMMUNITY)
Admission: EM | Admit: 2013-01-07 | Discharge: 2013-01-08 | Disposition: A | Discharge status: Home / Self Care | Payer: Medicare Other | Attending: Emergency Medicine | Admitting: Emergency Medicine

## 2013-01-07 DIAGNOSIS — F1021 Alcohol dependence, in remission: Secondary | ICD-10-CM | POA: Insufficient documentation

## 2013-01-07 DIAGNOSIS — Z8669 Personal history of other diseases of the nervous system and sense organs: Secondary | ICD-10-CM | POA: Insufficient documentation

## 2013-01-07 DIAGNOSIS — Z87440 Personal history of urinary (tract) infections: Secondary | ICD-10-CM | POA: Insufficient documentation

## 2013-01-07 DIAGNOSIS — Z880 Allergy status to penicillin: Secondary | ICD-10-CM | POA: Insufficient documentation

## 2013-01-07 DIAGNOSIS — Z79899 Other long term (current) drug therapy: Secondary | ICD-10-CM | POA: Insufficient documentation

## 2013-01-07 DIAGNOSIS — W19XXXA Unspecified fall, initial encounter: Secondary | ICD-10-CM

## 2013-01-07 DIAGNOSIS — S0003XA Contusion of scalp, initial encounter: Secondary | ICD-10-CM | POA: Insufficient documentation

## 2013-01-07 DIAGNOSIS — Y9301 Activity, walking, marching and hiking: Secondary | ICD-10-CM | POA: Insufficient documentation

## 2013-01-07 DIAGNOSIS — Z87891 Personal history of nicotine dependence: Secondary | ICD-10-CM | POA: Insufficient documentation

## 2013-01-07 DIAGNOSIS — S43101A Unspecified dislocation of right acromioclavicular joint, initial encounter: Secondary | ICD-10-CM

## 2013-01-07 DIAGNOSIS — E039 Hypothyroidism, unspecified: Secondary | ICD-10-CM | POA: Insufficient documentation

## 2013-01-07 DIAGNOSIS — S43109A Unspecified dislocation of unspecified acromioclavicular joint, initial encounter: Secondary | ICD-10-CM | POA: Insufficient documentation

## 2013-01-07 DIAGNOSIS — Z7982 Long term (current) use of aspirin: Secondary | ICD-10-CM | POA: Insufficient documentation

## 2013-01-07 DIAGNOSIS — Y9210 Unspecified residential institution as the place of occurrence of the external cause: Secondary | ICD-10-CM | POA: Insufficient documentation

## 2013-01-07 DIAGNOSIS — F411 Generalized anxiety disorder: Secondary | ICD-10-CM | POA: Insufficient documentation

## 2013-01-07 DIAGNOSIS — S0083XA Contusion of other part of head, initial encounter: Secondary | ICD-10-CM

## 2013-01-07 DIAGNOSIS — I10 Essential (primary) hypertension: Secondary | ICD-10-CM | POA: Insufficient documentation

## 2013-01-07 DIAGNOSIS — R296 Repeated falls: Secondary | ICD-10-CM | POA: Insufficient documentation

## 2013-01-07 HISTORY — DX: Neuralgia and neuritis, unspecified: M79.2

## 2013-01-07 HISTORY — DX: Unspecified mental disorder due to known physiological condition: F09

## 2013-01-07 HISTORY — DX: Alcohol dependence, uncomplicated: F10.20

## 2013-01-07 HISTORY — DX: Urinary tract infection, site not specified: N39.0

## 2013-01-07 HISTORY — DX: Anxiety disorder, unspecified: F41.9

## 2013-01-07 MED ORDER — HYDROCODONE-ACETAMINOPHEN 5-325 MG PO TABS
1.0000 | ORAL_TABLET | Freq: Once | ORAL | Status: AC
  Administered 2013-01-08: 1 via ORAL
  Filled 2013-01-07: qty 1

## 2013-01-07 MED ORDER — HYDROCODONE-ACETAMINOPHEN 5-325 MG PO TABS: 1.0000 | ORAL_TABLET | ORAL | Status: AC | PRN

## 2013-01-07 NOTE — ED Notes (Signed)
Pt. lost her balance and fell  while using her walker inside her room at the assisted living facility this evening , no LOC , presents with right shoulder pain / swelling / bruise and right temporal bruise . Respirations unlabored. Alert and concious.

## 2013-01-07 NOTE — ED Notes (Signed)
Pt placed in C-collar

## 2013-01-07 NOTE — ED Notes (Signed)
Patient returned from CT

## 2013-01-07 NOTE — ED Provider Notes (Signed)
Elderly 79 year old female who currently lives in an assisted care facility presents after falling and striking her right shoulder. She also bumped the right side of her head, was sent to the hospital with ambulance transport for evaluation of her injuries. She states that this was a mechanical fall. On her exam she has right shoulder hematoma with tenderness and the x-ray shows that she has a separated acromioclavicular joint. There is none. Be any other fractures. She also has a hematoma to the right side of her head it appears to have a normal mental status. Because she is on aspirin and elderly she will have CT scans of the head and cervical spine, she will need to be admitted if there are any other abnormalities however the family member wants to go home with her and stay with her at her location to take care of her instead of having her admitted to a rehabilitation facility.  I saw and evaluated the patient, reviewed the resident's note and I agree with the findings and plan.  Diagnosis  #1 a.c. joint separation of the right side #2 hematoma of the right scalp #3 minor head injury   Vida Roller, MD 01/08/13 1623

## 2013-01-07 NOTE — ED Provider Notes (Signed)
CSN: [id number]     Arrival date & time 01/07/13  2003 History   First MD Initiated Contact with Patient 01/07/13 2211     Chief Complaint  Patient presents with  . Fall   (Consider location/radiation/quality/duration/timing/severity/associated sxs/prior Treatment) HPI Comments: 79 y/o female with mechanical fall at [facility] assisted living facility at 7 pm. Denies LOC. Reports pain in right shoulder with deformity. Bruising noted to left frontal region. Ambulatory after event. No anticoagulant use. Daughter with her reports that she has not been disoriented or confused  The history is provided by the patient. No language interpreter was used.    Past Medical History  Diagnosis Date  . AAA (abdominal aortic aneurysm)   . Hypertension   . Hypothyroidism   . Hypothyroidism   . Trigeminal neuralgia   . Alcoholism   . Neuralgia   . Anxiety   . UTI (lower urinary tract infection)   . OBS (organic brain syndrome)    Past Surgical History  Procedure Laterality Date  . Abdominal angiogram    . Abdominal hysterectomy    . Brain surgery      janetta procedure  . Abdominal aortic aneurysm repair  12/14/2000   Family History  Problem Relation Age of Onset  . Other Brother     coronary artery disaease   History  Substance Use Topics  . Smoking status: Former Smoker    Types: Cigarettes    Quit date: 02/19/1962  . Smokeless tobacco: Never Used  . Alcohol Use: 4.2 oz/week    7 Glasses of wine per week   OB History   Grav Para Term Preterm Abortions TAB SAB Ect Mult Living                 Review of Systems  Constitutional: Negative for fever, diaphoresis and activity change.  HENT: Positive for neck pain.   Respiratory: Negative for chest tightness and shortness of breath.   Cardiovascular: Negative for chest pain and palpitations.  Gastrointestinal: Negative for nausea, vomiting and abdominal pain.  Genitourinary: Negative for difficulty urinating.  Musculoskeletal:  Positive for joint swelling and neck pain. Negative for back pain and gait problem.  Neurological: Negative for dizziness, syncope, weakness and numbness.  All other systems reviewed and are negative.    Allergies  Dilantin; Penicillins; and Tegretol  Home Medications   Current Outpatient Rx  Name  Route  Sig  Dispense  Refill  . alendronate (FOSAMAX) 35 MG tablet   Oral   Take 35 mg by mouth every 7 (seven) days. Take with a full glass of water on an empty stomach.         Marland Kitchen aspirin 81 MG tablet   Oral   Take 81 mg by mouth daily.         . calcium-vitamin D (OSCAL WITH D) 500-200 MG-UNIT per tablet   Oral   Take 1 tablet by mouth daily.         . cholecalciferol (VITAMIN D) 1000 UNITS tablet   Oral   Take 1,000 Units by mouth daily.         Marland Kitchen donepezil (ARICEPT) 10 MG tablet   Oral   Take 10 mg by mouth at bedtime as needed.         . fish oil-omega-3 fatty acids 1000 MG capsule   Oral   Take 1 g by mouth daily.         . folic acid (FOLVITE) 1 MG tablet  Oral   Take 1 mg by mouth daily.         Marland Kitchen levothyroxine (SYNTHROID, LEVOTHROID) 88 MCG tablet   Oral   Take 88 mcg by mouth daily.         Marland Kitchen lisinopril (PRINIVIL,ZESTRIL) 40 MG tablet   Oral   Take 40 mg by mouth daily.         Marland Kitchen LORazepam (ATIVAN) 0.5 MG tablet   Oral   Take 0.5 mg by mouth every 8 (eight) hours.         . thiamine (VITAMIN B-1) 100 MG tablet   Oral   Take 100 mg by mouth daily.         Marland Kitchen triamterene-hydrochlorothiazide (MAXZIDE-25) 37.5-25 MG per tablet   Oral   Take 1 tablet by mouth daily.         . vitamin B-12 (CYANOCOBALAMIN) 1000 MCG tablet   Oral   Take 1,000 mcg by mouth daily.          BP 147/70  Pulse 87  Temp(Src) 98.3 F (36.8 C) (Oral)  Resp 14  SpO2 99% Physical Exam  Vitals reviewed. Constitutional: She is oriented to person, place, and time. She appears well-developed. No distress.  HENT:  Head: Head is with contusion (right  parietal hematoma).  Nose: Nose normal.  Mouth/Throat: Oropharynx is clear and moist.  Eyes: EOM are normal. Pupils are equal, round, and reactive to light.  Cardiovascular: Normal rate, regular rhythm, normal heart sounds and intact distal pulses.   Pulmonary/Chest: Effort normal and breath sounds normal. No respiratory distress. She exhibits no tenderness.  Abdominal: Soft. Bowel sounds are normal. She exhibits no distension. There is no tenderness.  Musculoskeletal:       Right shoulder: She exhibits bony tenderness (over AC joint) and swelling. She exhibits normal range of motion, normal pulse and normal strength.       Right elbow: Normal.      Right wrist: Normal.       Right hip: Normal.       Left hip: Normal.       Cervical back: She exhibits tenderness (mild midline, paraspinal tenderness greater).       Thoracic back: Normal.       Lumbar back: Normal.       Right upper arm: Normal.       Right forearm: Normal.  Neurological: She is alert and oriented to person, place, and time. She has normal strength and normal reflexes. No cranial nerve deficit or sensory deficit. Coordination and gait normal. GCS eye subscore is 4. GCS verbal subscore is 5. GCS motor subscore is 6.  Skin: Skin is warm. Bruising (right shoulder) noted.    ED Course  Procedures (including critical care time) Labs Review Labs Reviewed - No data to display Imaging Review Dg Shoulder Right  01/07/2013   *RADIOLOGY REPORT*  Clinical Data: Status post fall; right shoulder pain.  RIGHT SHOULDER - 2+ VIEW  Comparison: None  Findings: There is apparent superior displacement of the distal clavicle with respect to the acromion, which may reflect some degree of acromioclavicular separation.  This is not well assessed due to limitations in positioning.  The right humeral head is seated within the glenoid fossa.  The acromioclavicular joint is unremarkable in appearance.  No significant soft tissue abnormalities are seen.   The visualized portions of the right lung are clear.  IMPRESSION: Apparent superior displacement of the distal clavicle with respect to the acromion,  raising concern for some degree of acromioclavicular separation.  Would correlate for associated symptoms.   Original Report Authenticated By: Tonia Ghent, M.D.   Ct Head Wo Contrast  01/07/2013   *RADIOLOGY REPORT*  Clinical Data:  Status post fall; right temporal bruising.  Concern for cervical spine injury.  CT HEAD WITHOUT CONTRAST AND CT CERVICAL SPINE WITHOUT CONTRAST  Technique:  Multidetector CT imaging of the head and cervical spine was performed following the standard protocol without intravenous contrast.  Multiplanar CT image reconstructions of the cervical spine were also generated.  Comparison: CT of the head performed 05/22/2009, and MRI of the brain performed 05/23/2009  CT HEAD  Findings: There is no evidence of acute infarction, mass lesion, or intra- or extra-axial hemorrhage on CT.  Prominence of the ventricles and sulci reflects mild cortical volume loss.  Mild scattered periventricular and subcortical white matter change likely reflects small vessel ischemic microangiopathy.  Mild cerebellar atrophy is noted.  The brainstem and fourth ventricle are within normal limits.  The basal ganglia are unremarkable in appearance.  The cerebral hemispheres demonstrate grossly normal gray-white differentiation. No mass effect or midline shift is seen.  There is no evidence of fracture; visualized osseous structures are unremarkable in appearance.  The orbits are within normal limits. There is complete opacification of the right maxillary sinus; the remaining paranasal sinuses and mastoid air cells are well-aerated. Soft tissue swelling is noted overlying the right parietal calvarium.  IMPRESSION:  1.  No evidence of traumatic intracranial injury or fracture. 2.  Soft tissue swelling overlying the right parietal calvarium. 3.  Mild cortical volume loss  and scattered small vessel ischemic microangiopathy. 4.  Complete opacification of the right maxillary sinus.  CT CERVICAL SPINE  Findings: There is no evidence of acute fracture or subluxation. There is grade 1 anterolisthesis of C3 on C4 and C4 on C5, and grade 1 anterolisthesis of C7 on T1.  Mild associated disc space narrowing is seen.  Prevertebral soft tissues are within normal limits.  There is incomplete fusion of the posterior arch of C1.  The thyroid gland is unremarkable in appearance.  Blebs are noted at the left lung apex.  Scattered calcification is noted at the carotid bifurcations bilaterally, more prominent on the right.  IMPRESSION:  1.  No evidence of acute fracture or subluxation along the cervical spine. 2.  Degenerative change noted along the cervical spine. 3.  Blebs at the left lung apex. 4.  Scattered calcification at the carotid bifurcations, more prominent on the right.  Carotid ultrasound could be considered for further evaluation, when and as deemed clinically appropriate.   Original Report Authenticated By: Tonia Ghent, M.D.   Ct Cervical Spine Wo Contrast  01/07/2013   *RADIOLOGY REPORT*  Clinical Data:  Status post fall; right temporal bruising.  Concern for cervical spine injury.  CT HEAD WITHOUT CONTRAST AND CT CERVICAL SPINE WITHOUT CONTRAST  Technique:  Multidetector CT imaging of the head and cervical spine was performed following the standard protocol without intravenous contrast.  Multiplanar CT image reconstructions of the cervical spine were also generated.  Comparison: CT of the head performed 05/22/2009, and MRI of the brain performed 05/23/2009  CT HEAD  Findings: There is no evidence of acute infarction, mass lesion, or intra- or extra-axial hemorrhage on CT.  Prominence of the ventricles and sulci reflects mild cortical volume loss.  Mild scattered periventricular and subcortical white matter change likely reflects small vessel ischemic microangiopathy.  Mild  cerebellar atrophy  is noted.  The brainstem and fourth ventricle are within normal limits.  The basal ganglia are unremarkable in appearance.  The cerebral hemispheres demonstrate grossly normal gray-white differentiation. No mass effect or midline shift is seen.  There is no evidence of fracture; visualized osseous structures are unremarkable in appearance.  The orbits are within normal limits. There is complete opacification of the right maxillary sinus; the remaining paranasal sinuses and mastoid air cells are well-aerated. Soft tissue swelling is noted overlying the right parietal calvarium.  IMPRESSION:  1.  No evidence of traumatic intracranial injury or fracture. 2.  Soft tissue swelling overlying the right parietal calvarium. 3.  Mild cortical volume loss and scattered small vessel ischemic microangiopathy. 4.  Complete opacification of the right maxillary sinus.  CT CERVICAL SPINE  Findings: There is no evidence of acute fracture or subluxation. There is grade 1 anterolisthesis of C3 on C4 and C4 on C5, and grade 1 anterolisthesis of C7 on T1.  Mild associated disc space narrowing is seen.  Prevertebral soft tissues are within normal limits.  There is incomplete fusion of the posterior arch of C1.  The thyroid gland is unremarkable in appearance.  Blebs are noted at the left lung apex.  Scattered calcification is noted at the carotid bifurcations bilaterally, more prominent on the right.  IMPRESSION:  1.  No evidence of acute fracture or subluxation along the cervical spine. 2.  Degenerative change noted along the cervical spine. 3.  Blebs at the left lung apex. 4.  Scattered calcification at the carotid bifurcations, more prominent on the right.  Carotid ultrasound could be considered for further evaluation, when and as deemed clinically appropriate.   Original Report Authenticated By: Tonia Ghent, M.D.    MDM   1. Acromioclavicular separation, right, initial encounter   2. Fall, initial encounter    3. Forehead contusion, initial encounter     79 y/o female presenting after mechanical fall at assisted living facility. No LOC and ambulatory after. Normal mental status. Reporting pain in right shoulder with bruising and deformity noted. Right arm neurovascularly intact. XR showing AC joint separation. CT head/cervical spine obtained as patient is elderly. No acute injury on imaging. Cervical spine cleared by myself. Sling applied to right arm and she will follow up with Orthopedics. Contact information given. Her daughther will stay at her home with her for assistance as she does not desire placement in a rehabilitation facility. Patient appropriate for discharge. Return precautions discussed and her and daughter voiced understanding.   Labs and imaging reviewed in my medical decision making if ordered. Patient discussed with my attending, Dr. Vaughan Basta, MD 01/08/13 1236

## 2013-01-07 NOTE — ED Notes (Signed)
Patient transported to CT 

## 2013-01-08 NOTE — ED Provider Notes (Signed)
I saw and evaluated the patient, reviewed the resident's note and I agree with the findings and plan.  Please see my separate note regarding my evaluation of the patient.     Vida Roller, MD 01/08/13 [phone]

## 2015-08-15 ENCOUNTER — Emergency Department (HOSPITAL_COMMUNITY)
Admission: EM | Admit: 2015-08-15 | Discharge: 2015-08-15 | Disposition: A | Discharge status: Home / Self Care | Payer: Medicare Other | Attending: Emergency Medicine | Admitting: Emergency Medicine

## 2015-08-15 ENCOUNTER — Emergency Department (HOSPITAL_COMMUNITY): Payer: Medicare Other

## 2015-08-15 ENCOUNTER — Encounter (HOSPITAL_COMMUNITY): Payer: Self-pay | Admitting: Emergency Medicine

## 2015-08-15 DIAGNOSIS — Y92121 Bathroom in nursing home as the place of occurrence of the external cause: Secondary | ICD-10-CM | POA: Diagnosis not present

## 2015-08-15 DIAGNOSIS — Y998 Other external cause status: Secondary | ICD-10-CM | POA: Insufficient documentation

## 2015-08-15 DIAGNOSIS — S0083XA Contusion of other part of head, initial encounter: Secondary | ICD-10-CM | POA: Insufficient documentation

## 2015-08-15 DIAGNOSIS — F419 Anxiety disorder, unspecified: Secondary | ICD-10-CM | POA: Diagnosis not present

## 2015-08-15 DIAGNOSIS — R7989 Other specified abnormal findings of blood chemistry: Secondary | ICD-10-CM

## 2015-08-15 DIAGNOSIS — Z87891 Personal history of nicotine dependence: Secondary | ICD-10-CM | POA: Diagnosis not present

## 2015-08-15 DIAGNOSIS — T148XXA Other injury of unspecified body region, initial encounter: Secondary | ICD-10-CM

## 2015-08-15 DIAGNOSIS — S3991XA Unspecified injury of abdomen, initial encounter: Secondary | ICD-10-CM | POA: Diagnosis not present

## 2015-08-15 DIAGNOSIS — Z880 Allergy status to penicillin: Secondary | ICD-10-CM | POA: Diagnosis not present

## 2015-08-15 DIAGNOSIS — S60212A Contusion of left wrist, initial encounter: Secondary | ICD-10-CM | POA: Diagnosis not present

## 2015-08-15 DIAGNOSIS — S60211A Contusion of right wrist, initial encounter: Secondary | ICD-10-CM | POA: Diagnosis not present

## 2015-08-15 DIAGNOSIS — S8992XA Unspecified injury of left lower leg, initial encounter: Secondary | ICD-10-CM | POA: Insufficient documentation

## 2015-08-15 DIAGNOSIS — W19XXXA Unspecified fall, initial encounter: Secondary | ICD-10-CM

## 2015-08-15 DIAGNOSIS — R778 Other specified abnormalities of plasma proteins: Secondary | ICD-10-CM

## 2015-08-15 DIAGNOSIS — I10 Essential (primary) hypertension: Secondary | ICD-10-CM | POA: Insufficient documentation

## 2015-08-15 DIAGNOSIS — Z7982 Long term (current) use of aspirin: Secondary | ICD-10-CM | POA: Diagnosis not present

## 2015-08-15 DIAGNOSIS — Z87440 Personal history of urinary (tract) infections: Secondary | ICD-10-CM | POA: Insufficient documentation

## 2015-08-15 DIAGNOSIS — S0011XA Contusion of right eyelid and periocular area, initial encounter: Secondary | ICD-10-CM | POA: Diagnosis not present

## 2015-08-15 DIAGNOSIS — E039 Hypothyroidism, unspecified: Secondary | ICD-10-CM | POA: Diagnosis not present

## 2015-08-15 DIAGNOSIS — S60222A Contusion of left hand, initial encounter: Secondary | ICD-10-CM | POA: Insufficient documentation

## 2015-08-15 DIAGNOSIS — S60221A Contusion of right hand, initial encounter: Secondary | ICD-10-CM | POA: Diagnosis not present

## 2015-08-15 DIAGNOSIS — Y9389 Activity, other specified: Secondary | ICD-10-CM | POA: Diagnosis not present

## 2015-08-15 DIAGNOSIS — F0390 Unspecified dementia without behavioral disturbance: Secondary | ICD-10-CM | POA: Diagnosis not present

## 2015-08-15 DIAGNOSIS — W1839XA Other fall on same level, initial encounter: Secondary | ICD-10-CM | POA: Insufficient documentation

## 2015-08-15 DIAGNOSIS — T07XXXA Unspecified multiple injuries, initial encounter: Secondary | ICD-10-CM

## 2015-08-15 DIAGNOSIS — Z79899 Other long term (current) drug therapy: Secondary | ICD-10-CM | POA: Insufficient documentation

## 2015-08-15 DIAGNOSIS — S8991XA Unspecified injury of right lower leg, initial encounter: Secondary | ICD-10-CM | POA: Insufficient documentation

## 2015-08-15 DIAGNOSIS — S61511A Laceration without foreign body of right wrist, initial encounter: Secondary | ICD-10-CM | POA: Diagnosis not present

## 2015-08-15 DIAGNOSIS — S40011A Contusion of right shoulder, initial encounter: Secondary | ICD-10-CM | POA: Diagnosis not present

## 2015-08-15 DIAGNOSIS — S6991XA Unspecified injury of right wrist, hand and finger(s), initial encounter: Secondary | ICD-10-CM | POA: Diagnosis present

## 2015-08-15 LAB — URINALYSIS, ROUTINE W REFLEX MICROSCOPIC
Bilirubin Urine: NEGATIVE
Glucose, UA: NEGATIVE mg/dL
KETONES UR: NEGATIVE mg/dL
Nitrite: NEGATIVE
PH: 7 (ref 5.0–8.0)
PROTEIN: NEGATIVE mg/dL
Specific Gravity, Urine: 1.01 (ref 1.005–1.030)

## 2015-08-15 LAB — URINE MICROSCOPIC-ADD ON

## 2015-08-15 LAB — CBC WITH DIFFERENTIAL/PLATELET
BASOS PCT: 0 %
Basophils Absolute: 0 10*3/uL (ref 0.0–0.1)
Eosinophils Absolute: 0.1 10*3/uL (ref 0.0–0.7)
Eosinophils Relative: 1 %
HEMATOCRIT: 38.6 % (ref 36.0–46.0)
Hemoglobin: 13.1 g/dL (ref 12.0–15.0)
Lymphocytes Relative: 14 %
Lymphs Abs: 1.2 10*3/uL (ref 0.7–4.0)
MCH: 32.2 pg (ref 26.0–34.0)
MCHC: 33.9 g/dL (ref 30.0–36.0)
MCV: 94.8 fL (ref 78.0–100.0)
Monocytes Absolute: 0.8 10*3/uL (ref 0.1–1.0)
Monocytes Relative: 10 %
NEUTROS ABS: 6.1 10*3/uL (ref 1.7–7.7)
Neutrophils Relative %: 75 %
PLATELETS: 156 10*3/uL (ref 150–400)
RBC: 4.07 MIL/uL (ref 3.87–5.11)
RDW: 13.5 % (ref 11.5–15.5)
WBC: 8.2 10*3/uL (ref 4.0–10.5)

## 2015-08-15 LAB — BASIC METABOLIC PANEL
ANION GAP: 10 (ref 5–15)
BUN: 11 mg/dL (ref 6–20)
CO2: 25 mmol/L (ref 22–32)
Calcium: 9.1 mg/dL (ref 8.9–10.3)
Chloride: 99 mmol/L — ABNORMAL LOW (ref 101–111)
Creatinine, Ser: 0.75 mg/dL (ref 0.44–1.00)
GFR calc non Af Amer: >60 mL/min (ref >60)
GLUCOSE: 119 mg/dL — AB (ref 65–99)
Potassium: 4.1 mmol/L (ref 3.5–5.1)
Sodium: 134 mmol/L — ABNORMAL LOW (ref 135–145)

## 2015-08-15 LAB — I-STAT TROPONIN, ED: Troponin i, poc: 0.33 ng/mL (ref 0.00–0.08)

## 2015-08-15 MED ORDER — IOPAMIDOL (ISOVUE-300) INJECTION 61%
INTRAVENOUS | Status: AC
  Administered 2015-08-15: 100 mL
  Filled 2015-08-15: qty 100

## 2015-08-15 NOTE — Discharge Instructions (Signed)
Read the information below.  You may return to the Emergency Department at any time for worsening condition or any new symptoms that concern you.    Please have your primary care provider recheck your urine with a clean catch specimen when you follow up.  If you develop fevers, abdominal pain, pain when you urinate, please return to the Emergency Department.  You troponin level was elevated, which is concerning that there may be active damage occuring to your heart, such as with a heart attack.  We respect your decision to go home but if you change your mind or develop symptoms that concern you such as chest pain or difficulty breathing and would like further treatment, please call 911 or return at any time.    Contusion A contusion is a deep bruise. Contusions are the result of a blunt injury to tissues and muscle fibers under the skin. The injury causes bleeding under the skin. The skin overlying the contusion may turn blue, purple, or yellow. Minor injuries will give you a painless contusion, but more severe contusions may stay painful and swollen for a few weeks.  CAUSES  This condition is usually caused by a blow, trauma, or direct force to an area of the body. SYMPTOMS  Symptoms of this condition include:  Swelling of the injured area.  Pain and tenderness in the injured area.  Discoloration. The area may have redness and then turn blue, purple, or yellow. DIAGNOSIS  This condition is diagnosed based on a physical exam and medical history. An X-ray, CT scan, or MRI may be needed to determine if there are any associated injuries, such as broken bones (fractures). TREATMENT  Specific treatment for this condition depends on what area of the body was injured. In general, the best treatment for a contusion is resting, icing, applying pressure to (compression), and elevating the injured area. This is often called the RICE strategy. Over-the-counter anti-inflammatory medicines may also be  recommended for pain control.  HOME CARE INSTRUCTIONS   Rest the injured area.  If directed, apply ice to the injured area:  Put ice in a plastic bag.  Place a towel between your skin and the bag.  Leave the ice on for 20 minutes, 2-3 times per day.  If directed, apply light compression to the injured area using an elastic bandage. Make sure the bandage is not wrapped too tightly. Remove and reapply the bandage as directed by your health care provider.  If possible, raise (elevate) the injured area above the level of your heart while you are sitting or lying down.  Take over-the-counter and prescription medicines only as told by your health care provider. SEEK MEDICAL CARE IF:  Your symptoms do not improve after several days of treatment.  Your symptoms get worse.  You have difficulty moving the injured area. SEEK IMMEDIATE MEDICAL CARE IF:   You have severe pain.  You have numbness in a hand or foot.  Your hand or foot turns pale or cold.   This information is not intended to replace advice given to you by your health care provider. Make sure you discuss any questions you have with your health care provider.   Document Released: 12/28/2004 Document Revised: 12/09/2014 Document Reviewed: 08/05/2014 Elsevier Interactive Patient Education 2016 [employer]  Facial or Scalp Contusion A facial or scalp contusion is a deep bruise on the face or head. Injuries to the face and head generally cause a lot of swelling, especially around the eyes. Contusions are  the result of an injury that caused bleeding under the skin. The contusion may turn blue, purple, or yellow. Minor injuries will give you a painless contusion, but more severe contusions may stay painful and swollen for a few weeks.  CAUSES  A facial or scalp contusion is caused by a blunt injury or trauma to the face or head area.  SIGNS AND SYMPTOMS   Swelling of the injured area.   Discoloration of the injured area.    Tenderness, soreness, or pain in the injured area.  DIAGNOSIS  The diagnosis can be made by taking a medical history and doing a physical exam. An X-ray exam, CT scan, or MRI may be needed to determine if there are any associated injuries, such as broken bones (fractures). TREATMENT  Often, the best treatment for a facial or scalp contusion is applying cold compresses to the injured area. Over-the-counter medicines may also be recommended for pain control.  HOME CARE INSTRUCTIONS   Only take over-the-counter or prescription medicines as directed by your health care provider.   Apply ice to the injured area.   Put ice in a plastic bag.   Place a towel between your skin and the bag.   Leave the ice on for 20 minutes, 2-3 times a day.  SEEK MEDICAL CARE IF:  You have bite problems.   You have pain with chewing.   You are concerned about facial defects. SEEK IMMEDIATE MEDICAL CARE IF:  You have severe pain or a headache that is not relieved by medicine.   You have unusual sleepiness, confusion, or personality changes.   You throw up (vomit).   You have a persistent nosebleed.   You have double vision or blurred vision.   You have fluid drainage from your nose or ear.   You have difficulty walking or using your arms or legs.  MAKE SURE YOU:   Understand these instructions.  Will watch your condition.  Will get help right away if you are not doing well or get worse.   This information is not intended to replace advice given to you by your health care provider. Make sure you discuss any questions you have with your health care provider.   Document Released: 04/27/2004 Document Revised: 04/10/2014 Document Reviewed: 10/31/2012 Elsevier Interactive Patient Education [employer]

## 2015-08-15 NOTE — ED Notes (Signed)
Pt arrives via EMS from [facility], found face down on floor in bathroom by staff. Mulitple brusing to R side of body including R shoulder, eye, redness to bilateral knees. Per EMS, multiple falls recently.

## 2015-08-15 NOTE — ED Notes (Signed)
Patient transported to X-ray 

## 2015-08-15 NOTE — ED Notes (Signed)
Irving BurtonEmily PA at bedside with this RN. Pt. Has abd tenderness, states she needs to urinate. Pt. Placed on bed pan with no success. Bladder scan completed and urine level >400. Pt. Assisted to bedside commode. Pt. Output approximately 50 ml. PA updated.

## 2015-08-15 NOTE — ED Provider Notes (Signed)
CSN: [id number]     Arrival date & time 08/15/15  0539 History   First MD Initiated Contact with Patient 08/15/15 0617     Chief Complaint  Patient presents with  . Fall     (Consider location/radiation/quality/duration/timing/severity/associated sxs/prior Treatment) The history is provided by the patient and a relative.     Pt with hx alcoholism, AAA, HTN, memory loss brought in from assisted living facility [facility] after fall.  Was found face down in bathroom by staff.  Daughter reports pt fell 3 days ago as well.  Notes when she falls if she can get up by herself she just goes on with her day.  Daughter notes pt is supposed to use a wheelchair and enjoys being independent, transfers herself and moves it around with her feet but often forgets to put the brake on, has fallen several times for this reason.  Daughter is a retired Engineer, civil (consulting), cleaned and dressed right wrist wound from 3 days ago - states some of the bruising today is from fall three days ago including some of the facial bruising and the right hand and wrist bruise.  Pt denies any pain.  Doesn't remember falling.  She is not on blood thinners.   Daughter is patient's medical power of attorney   Level V caveat for memory loss/dementia.    Past Medical History  Diagnosis Date  . AAA (abdominal aortic aneurysm) (HCC)   . Hypertension   . Hypothyroidism   . Hypothyroidism   . Trigeminal neuralgia   . Alcoholism (HCC)   . Neuralgia   . Anxiety   . UTI (lower urinary tract infection)   . OBS (organic brain syndrome)    Past Surgical History  Procedure Laterality Date  . Abdominal angiogram    . Abdominal hysterectomy    . Brain surgery      janetta procedure  . Abdominal aortic aneurysm repair  12/14/2000   Family History  Problem Relation Age of Onset  . Other Brother     coronary artery disaease   Social History  Substance Use Topics  . Smoking status: Former Smoker    Types: Cigarettes    Quit date:  02/19/1962  . Smokeless tobacco: Never Used  . Alcohol Use: 4.2 oz/week    7 Glasses of wine per week   OB History    No data available     Review of Systems  Unable to perform ROS: Dementia      Allergies  Dilantin; Penicillins; and Tegretol  Home Medications   Prior to Admission medications   Medication Sig Start Date End Date Taking? Authorizing Provider  acetaminophen (TYLENOL) 325 MG tablet Take 650 mg by mouth every 4 (four) hours as needed for headache.   Yes Historical Provider, MD  aspirin 81 MG tablet Take 81 mg by mouth daily.   Yes Historical Provider, MD  carvedilol (COREG) 3.125 MG tablet Take 3.125 mg by mouth 2 (two) times daily with a meal.   Yes Historical Provider, MD  cholecalciferol (VITAMIN D) 400 units TABS tablet Take 400 Units by mouth daily.   Yes Historical Provider, MD  docusate sodium (COLACE) 100 MG capsule Take 100 mg by mouth at bedtime.   Yes Historical Provider, MD  hydrochlorothiazide (HYDRODIURIL) 12.5 MG tablet Take 12.5 mg by mouth 3 (three) times a week. Monday, Wednesday and Fridays.   Yes Historical Provider, MD  levothyroxine (SYNTHROID, LEVOTHROID) 50 MCG tablet Take 50 mcg by mouth daily before breakfast.  Yes Historical Provider, MD  lisinopril (PRINIVIL,ZESTRIL) 40 MG tablet Take 22.5 mg by mouth daily.    Yes Historical Provider, MD  magnesium hydroxide (MILK OF MAGNESIA) 400 MG/5ML suspension Take 30 mLs by mouth daily as needed for mild constipation.   Yes Historical Provider, MD  Melatonin 3 MG TABS Take 3 mg by mouth at bedtime.   Yes Historical Provider, MD  psyllium (REGULOID) 0.52 g capsule Take 0.52 g by mouth daily.   Yes Historical Provider, MD  traMADol (ULTRAM) 50 MG tablet Take 50 mg by mouth every 8 (eight) hours as needed for moderate pain.   Yes Historical Provider, MD  vitamin B-12 (CYANOCOBALAMIN) 1000 MCG tablet Take 1,000 mcg by mouth daily.   Yes Historical Provider, MD   BP 177/62 mmHg  Pulse 76  Temp(Src)  98.1 F (36.7 C) (Oral)  Resp 18  SpO2 96% Physical Exam  Constitutional: She appears well-developed and well-nourished. No distress.  HENT:  Head: Normocephalic.  Right periorbital and right facial ecchymosis and edema.  No lacerations or active bleeding.  Tender to palpation.    Neck: Neck supple.  Cardiovascular: Normal rate and regular rhythm.   Pulmonary/Chest: Effort normal and breath sounds normal. No respiratory distress. She has no wheezes. She has no rales.  Abdominal: Soft. She exhibits no distension. There is no rebound and no guarding.  Genitourinary:  No stool impaction  Musculoskeletal:  All extremities with normal ROM and strength with exception RUE with limited ROM at right shoulder, ecchymosis over right shoulder, tender to palpation.  Bilateral knees with mild redness, L>R, nontender.    Bilateral dorsal hands and wrists with ecchymosis, no tenderness.  Two small skin tears on right wrist, bandaged.  No erythema, edema, warmth, discharge, or tenderness   Neurological: She is alert.  Skin: She is not diaphoretic.  Nursing note and vitals reviewed.   ED Course  Procedures (including critical care time) Labs Review Labs Reviewed  BASIC METABOLIC PANEL - Abnormal; Notable for the following:    Sodium 134 (*)    Chloride 99 (*)    Glucose, Bld 119 (*)    All other components within normal limits  URINALYSIS, ROUTINE W REFLEX MICROSCOPIC (NOT AT [facility]) - Abnormal; Notable for the following:    APPearance CLOUDY (*)    Hgb urine dipstick MODERATE (*)    Leukocytes, UA TRACE (*)    All other components within normal limits  URINE MICROSCOPIC-ADD ON - Abnormal; Notable for the following:    Squamous Epithelial / LPF 0-5 (*)    Bacteria, UA FEW (*)    All other components within normal limits  CBC WITH DIFFERENTIAL/PLATELET    Imaging Review Dg Chest 1 View  08/15/2015  CLINICAL DATA:  Fall today with bruising to right body and shoulder. EXAM: CHEST 1 VIEW  COMPARISON:  05/22/2009 FINDINGS: Lungs are adequately inflated without focal consolidation or effusion. Cardiomediastinal silhouette is within normal. There is calcified plaque over the aortic arch. Old right lower posterior lateral rib fracture. Degenerative changes of the spine with stable biphasic curvature of the spine. Penciling of the distal right clavicle likely postsurgical. IMPRESSION: No acute findings. Electronically Signed   By: Elberta Fortis M.D.   On: 08/15/2015 09:00   Dg Shoulder Right  08/15/2015  CLINICAL DATA:  Fall today with bruising to right shoulder. EXAM: RIGHT SHOULDER - 2+ VIEW COMPARISON:  01/07/2013 FINDINGS: Examination demonstrates no evidence of fracture or dislocation. Likely postsurgical change of the distal clavicle  and AC joint. Degenerative change of the spine. Calcified plaque over the aortic arch. IMPRESSION: No acute findings. Electronically Signed   By: Elberta Fortis M.D.   On: 08/15/2015 08:52   Ct Head Wo Contrast  08/15/2015  CLINICAL DATA:  82 year old female with history of trauma from a fall. Found down in the bathroom. Bruising over the right eye, with large hematoma in the periorbital region. Multiple recent falls. EXAM: CT HEAD WITHOUT CONTRAST CT MAXILLOFACIAL WITHOUT CONTRAST CT CERVICAL SPINE WITHOUT CONTRAST TECHNIQUE: Multidetector CT imaging of the head, cervical spine, and maxillofacial structures were performed using the standard protocol without intravenous contrast. Multiplanar CT image reconstructions of the cervical spine and maxillofacial structures were also generated. COMPARISON:  CT of the head and cervical spine 01/07/2013. FINDINGS: CT HEAD FINDINGS Small amount of periorbital soft tissue swelling on the right side where there is a 1.1 x 0.7 cm focus of high attenuation immediately anterior to the right maxilla, compatible with a tiny posttraumatic hematoma. Moderate cerebral and mild cerebellar atrophy. Patchy areas of decreased attenuation  are noted throughout the deep and periventricular white matter of the cerebral hemispheres bilaterally, compatible with mild chronic microvascular ischemic disease. No acute displaced skull fractures are identified. No acute intracranial abnormality. Specifically, no evidence of acute post-traumatic intracranial hemorrhage, no definite regions of acute/subacute cerebral ischemia, no focal mass, mass effect, hydrocephalus or abnormal intra or extra-axial fluid collections. Mastoids are well pneumatized bilaterally. 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 There is extensive mucosal thickening in the right ethmoid sinuses, frontoethmoidal recess and frontal sinus, related to chronic sinusitis. CT MAXILLOFACIAL FINDINGS Small amount of periorbital soft tissue swelling with small paravertebral hematoma anterior to the right maxilla (as discussed above). Right globe and retro-orbital soft tissues are otherwise grossly normal in appearance. The right orbit is intact. No acute displaced facial bone fractures. Specifically, pterygoid plates are intact. Mandible is intact, and the mandibular condyles are located bilaterally. Extensive chronic paranasal sinus disease (discussed above), most notable for a right maxillary sinus mucocele. CT CERVICAL SPINE FINDINGS No acute displaced fractures of the cervical spine. 4 mm of anterolisthesis of C4 upon C5, likely chronic. Alignment is otherwise anatomic. Prevertebral soft tissues are normal. Multilevel degenerative disc disease, most pronounced at C3-C4, C4-C5, C5-C6 and C6-C7. Multilevel facet arthropathy. Incomplete posterior ring of C1 (normal anatomical variant) incidentally noted. Visualized portions of the upper thorax demonstrate pleural-parenchymal thickening  throughout the lung apices bilaterally, most compatible with chronic areas of post infectious or inflammatory scarring. Extensive atherosclerosis. Subpleural bulla in the medial left upper lobe also noted. IMPRESSION: 1. Small right periorbital contusion and small hematoma, as above. 2. No acute displaced facial bone fractures or skull fractures. 3. No evidence of acute intracranial trauma. 4. No evidence of acute traumatic injury to the cervical spine. 5. Moderate cerebral and mild cerebellar atrophy with mild chronic microvascular ischemic changes in the cerebral white matter. 6. Chronic paranasal sinus disease most notable for a right-sided mucocele, as discussed above. 7. Multilevel degenerative disc disease and cervical spondylosis, as above. Electronically Signed   By: Trudie Reed M.D.   On: 08/15/2015 09:30   Ct Cervical Spine Wo Contrast  08/15/2015  CLINICAL DATA:  82 year old female with history of trauma from a fall. Found down in the bathroom. Bruising over the right eye, with large hematoma in  the periorbital region. Multiple recent falls. EXAM: CT HEAD WITHOUT CONTRAST CT MAXILLOFACIAL WITHOUT CONTRAST CT CERVICAL SPINE WITHOUT CONTRAST TECHNIQUE: Multidetector CT imaging of the head, cervical spine, and maxillofacial structures were performed using the standard protocol without intravenous contrast. Multiplanar CT image reconstructions of the cervical spine and maxillofacial structures were also generated. COMPARISON:  CT of the head and cervical spine 01/07/2013. FINDINGS: CT HEAD FINDINGS Small amount of periorbital soft tissue swelling on the right side where there is a 1.1 x 0.7 cm focus of high attenuation immediately anterior to the right maxilla, compatible with a tiny posttraumatic hematoma. Moderate cerebral and mild cerebellar atrophy. Patchy areas of decreased attenuation are noted throughout the deep and periventricular white matter of the cerebral hemispheres bilaterally,  compatible with mild chronic microvascular ischemic disease. No acute displaced skull fractures are identified. No acute intracranial abnormality. Specifically, no evidence of acute post-traumatic intracranial hemorrhage, no definite regions of acute/subacute cerebral ischemia, no focal mass, mass effect, hydrocephalus or abnormal intra or extra-axial fluid collections. Mastoids are well pneumatized bilaterally. 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 There is extensive mucosal thickening in the right ethmoid sinuses, frontoethmoidal recess and frontal sinus, related to chronic sinusitis. CT MAXILLOFACIAL FINDINGS Small amount of periorbital soft tissue swelling with small paravertebral hematoma anterior to the right maxilla (as discussed above). Right globe and retro-orbital soft tissues are otherwise grossly normal in appearance. The right orbit is intact. No acute displaced facial bone fractures. Specifically, pterygoid plates are intact. Mandible is intact, and the mandibular condyles are located bilaterally. Extensive chronic paranasal sinus disease (discussed above), most notable for a right maxillary sinus mucocele. CT CERVICAL SPINE FINDINGS No acute displaced fractures of the cervical spine. 4 mm of anterolisthesis of C4 upon C5, likely chronic. Alignment is otherwise anatomic. Prevertebral soft tissues are normal. Multilevel degenerative disc disease, most pronounced at C3-C4, C4-C5, C5-C6 and C6-C7. Multilevel facet arthropathy. Incomplete posterior ring of C1 (normal anatomical variant) incidentally noted. Visualized portions of the upper thorax demonstrate pleural-parenchymal thickening throughout the lung apices bilaterally, most compatible with chronic areas of post infectious or  inflammatory scarring. Extensive atherosclerosis. Subpleural bulla in the medial left upper lobe also noted. IMPRESSION: 1. Small right periorbital contusion and small hematoma, as above. 2. No acute displaced facial bone fractures or skull fractures. 3. No evidence of acute intracranial trauma. 4. No evidence of acute traumatic injury to the cervical spine. 5. Moderate cerebral and mild cerebellar atrophy with mild chronic microvascular ischemic changes in the cerebral white matter. 6. Chronic paranasal sinus disease most notable for a right-sided mucocele, as discussed above. 7. Multilevel degenerative disc disease and cervical spondylosis, as above. Electronically Signed   By: Trudie Reedaniel  Entrikin M.D.   On: 08/15/2015 09:30   Dg Hips Bilat With Pelvis 2v  08/15/2015  CLINICAL DATA:  Fall today with injury to right side of body. EXAM: DG HIP (WITH OR WITHOUT PELVIS) 2V BILAT COMPARISON:  CT 02/20/2012 FINDINGS: There are mild symmetric degenerative changes of the hips. No evidence of acute fracture or dislocation. Bilateral common iliac artery stents. Mild degenerate change of the sacroiliac joints. IMPRESSION: Mild symmetric degenerative change of the hips.  No acute findings. Electronically Signed   By: Elberta Fortisaniel  Boyle M.D.   On: 08/15/2015 08:54   Ct Maxillofacial Wo Cm  08/15/2015  CLINICAL DATA:  82 year old female with history of trauma from a fall. Found down in the bathroom. Bruising over the right eye, with large hematoma in the periorbital region. Multiple recent falls. EXAM: CT HEAD WITHOUT CONTRAST CT MAXILLOFACIAL WITHOUT CONTRAST CT CERVICAL SPINE WITHOUT CONTRAST TECHNIQUE: Multidetector CT imaging of the head, cervical spine, and maxillofacial structures were performed using the standard protocol without intravenous contrast. Multiplanar CT image reconstructions of the cervical spine and maxillofacial structures were also generated. COMPARISON:  CT of the head and cervical spine 01/07/2013.  FINDINGS: CT HEAD FINDINGS Small amount of periorbital soft tissue swelling on the right side where there is a 1.1 x 0.7 cm focus of high attenuation immediately anterior to the right maxilla, compatible with a tiny posttraumatic hematoma. Moderate cerebral and mild cerebellar atrophy. Patchy areas of decreased attenuation are noted throughout the deep and periventricular white matter of the cerebral hemispheres bilaterally, compatible with mild chronic microvascular ischemic disease. No acute displaced skull fractures are identified. No acute intracranial abnormality. Specifically, no evidence of acute post-traumatic intracranial hemorrhage, no definite regions of acute/subacute cerebral ischemia, no focal mass, mass effect, hydrocephalus or abnormal intra or extra-axial fluid collections. Mastoids are well pneumatized bilaterally. 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 There is extensive mucosal thickening in the right ethmoid sinuses, frontoethmoidal recess and frontal sinus, related to chronic sinusitis. CT MAXILLOFACIAL FINDINGS Small amount of periorbital soft tissue swelling with small paravertebral hematoma anterior to the right maxilla (as discussed above). Right globe and retro-orbital soft tissues are otherwise grossly normal in appearance. The right orbit is intact. No acute displaced facial bone fractures. Specifically, pterygoid plates are intact. Mandible is intact, and the mandibular condyles are located bilaterally. Extensive chronic paranasal sinus disease (discussed above), most notable for a right maxillary sinus mucocele. CT CERVICAL SPINE FINDINGS No acute displaced fractures of the cervical spine. 4 mm of anterolisthesis of C4 upon C5, likely chronic. Alignment is otherwise  anatomic. Prevertebral soft tissues are normal. Multilevel degenerative disc disease, most pronounced at C3-C4, C4-C5, C5-C6 and C6-C7. Multilevel facet arthropathy. Incomplete posterior ring of C1 (normal anatomical variant) incidentally noted. Visualized portions of the upper thorax demonstrate pleural-parenchymal thickening throughout the lung apices bilaterally, most compatible with chronic areas of post infectious or inflammatory scarring. Extensive atherosclerosis. Subpleural bulla in the medial left upper lobe also noted. IMPRESSION: 1. Small right periorbital contusion and small hematoma, as above. 2. No acute displaced facial bone fractures or skull fractures. 3. No evidence of acute intracranial trauma. 4. No evidence of acute traumatic injury to the cervical spine. 5. Moderate cerebral and mild cerebellar atrophy with mild chronic microvascular ischemic changes in the cerebral white matter. 6. Chronic paranasal sinus disease most notable for a right-sided mucocele, as discussed above. 7. Multilevel degenerative disc disease and cervical spondylosis, as above. Electronically Signed   By: Trudie Reed M.D.   On: 08/15/2015 09:30      EKG Interpretation   Date/Time:  Sunday Aug 15 2015 09:24:58 EDT Ventricular Rate:  71 PR Interval:  224 QRS Duration: 131 QT Interval:  462 QTC Calculation: 502 R Axis:   -65 Text Interpretation:  Sinus rhythm Prolonged PR interval RBBB and LAFB  slight ST depression V3 Confirmed by BELFI  MD, MELANIE ([id number]) on  08/15/2015 10:58:49 AM        9:55 AM On initial exam,  pt reported discomfort with abdominal exam because she needed to urinate. Pt has urinated, continues to have discomfort with palpation of abdomen saying that it does not hurt but she does need to urinate.  She has an adult brief on but is unable to void (but did void quite a bit earlier per nurse).  Bladder scan shows >346cc in bladder.  Will provide bedside commode for voiding trial and  then reexamine abdomen.  Tech unable to cath pt x 2.  UA, even though collected on bedpan, does not appear infected.    12:39 PM Patient and patient's daughter made aware of elevated troponin.  Pt and daughter both decline admission and decline further workup.  Daughter notes pt is DNR and has made it clear that patient prefers to live her life and wants to die in assisted living and not in medical facilities.  They do not want to attempt a foley catheter because pt is able to void occasionally.    Pt reports she is ready to go, has events to go to today.  Daughter is comfortable with discharge.  Discussed all results with daughter.  Pt to follow up with Dr Redmond School at facility.  Will not ambulate pt as she ambulates with wheelchair at home.  MDM   Final diagnoses:  Fall, initial encounter  Facial contusion, initial encounter  Multiple contusions  Multiple skin tears  Elevated troponin    Afebrile, nontoxic patient with unwitnessed fall today.  CT head, c-spine, maxillofacial and xrays of chest, right shoulder, pelvis all negative for fracture.  Pt does have contusions.  Pt also seen by Dr Fredderick Phenix.  Daughter reports pt falls frequently and has stayed at her baseline throughout ED visit.  Pt has multiple contusions but no concerning findings on workup.  She has had c/o needing to urinate frequently but then does not void - it is unclear if she is unable to or if she just gets confused or loses focus while she is supposed to be going (per nursing).  She is also having bowel movements without knowing it. Patient's daughter was bedside throughout visit and is aware of all of this, seems to be unchanged from baseline.  Doubt acute cord syndrome.  I suspect this is more of a chronic condition related to dementia.  CT abd/pelvis shows large amount of stool but both patient and daughter state pt is having solid and soft bowel movements - she does not have stool impaction on exam.  Pt did have some EKG changes and  troponin was positive.  I spoke with daughter at length about this.  She is a retired Charity fundraiser of many years and demonstrated a high level of understanding of this.  She is also the patient's health care power of attorney and is familiar with the patient's long term goals for her care.  She was discharged given patient's age and goals for treatment, advised to return at any time.  Pt had a Mother's Day lunch that she wanted to attend.   D/C home with close PCP follow up.  Discussed result, findings, treatment, and follow up  with patient.  Pt given return precautions.  Pt verbalizes understanding and agrees with plan.        Trixie Dredge, PA-C 08/15/15 1603  Rolan Bucco, MD 08/16/15 [phone]

## 2015-08-15 NOTE — ED Notes (Signed)
Two attemps of in &out cath was unable to cath.also with daughter was at bedside still unable.let PA EMILY AWEAR

## 2015-08-15 NOTE — ED Notes (Signed)
Pt uncooperative with blood pressure cuff

## 2016-03-23 ENCOUNTER — Other Ambulatory Visit (HOSPITAL_COMMUNITY): Payer: Medicare Other

## 2016-03-23 ENCOUNTER — Inpatient Hospital Stay (HOSPITAL_COMMUNITY): Payer: Medicare Other

## 2016-03-23 ENCOUNTER — Encounter (HOSPITAL_COMMUNITY): Payer: Self-pay | Admitting: Emergency Medicine

## 2016-03-23 ENCOUNTER — Emergency Department (HOSPITAL_COMMUNITY): Payer: Medicare Other

## 2016-03-23 ENCOUNTER — Inpatient Hospital Stay (HOSPITAL_COMMUNITY)
Admission: EM | Admit: 2016-03-23 | Discharge: 2016-03-26 | DRG: 480 | Disposition: A | Discharge status: Skilled Nursing Facility | Payer: Medicare Other | Attending: Internal Medicine | Admitting: Internal Medicine

## 2016-03-23 DIAGNOSIS — S72141A Displaced intertrochanteric fracture of right femur, initial encounter for closed fracture: Secondary | ICD-10-CM | POA: Diagnosis present

## 2016-03-23 DIAGNOSIS — I10 Essential (primary) hypertension: Secondary | ICD-10-CM | POA: Diagnosis present

## 2016-03-23 DIAGNOSIS — E871 Hypo-osmolality and hyponatremia: Secondary | ICD-10-CM | POA: Diagnosis present

## 2016-03-23 DIAGNOSIS — Z880 Allergy status to penicillin: Secondary | ICD-10-CM | POA: Diagnosis not present

## 2016-03-23 DIAGNOSIS — F419 Anxiety disorder, unspecified: Secondary | ICD-10-CM | POA: Diagnosis present

## 2016-03-23 DIAGNOSIS — M6281 Muscle weakness (generalized): Secondary | ICD-10-CM

## 2016-03-23 DIAGNOSIS — F05 Delirium due to known physiological condition: Secondary | ICD-10-CM | POA: Diagnosis not present

## 2016-03-23 DIAGNOSIS — Z7982 Long term (current) use of aspirin: Secondary | ICD-10-CM

## 2016-03-23 DIAGNOSIS — W1830XA Fall on same level, unspecified, initial encounter: Secondary | ICD-10-CM | POA: Diagnosis present

## 2016-03-23 DIAGNOSIS — G459 Transient cerebral ischemic attack, unspecified: Secondary | ICD-10-CM | POA: Diagnosis not present

## 2016-03-23 DIAGNOSIS — Z8679 Personal history of other diseases of the circulatory system: Secondary | ICD-10-CM | POA: Diagnosis not present

## 2016-03-23 DIAGNOSIS — D62 Acute posthemorrhagic anemia: Secondary | ICD-10-CM | POA: Diagnosis not present

## 2016-03-23 DIAGNOSIS — Z87891 Personal history of nicotine dependence: Secondary | ICD-10-CM

## 2016-03-23 DIAGNOSIS — I350 Nonrheumatic aortic (valve) stenosis: Secondary | ICD-10-CM

## 2016-03-23 DIAGNOSIS — Z419 Encounter for procedure for purposes other than remedying health state, unspecified: Secondary | ICD-10-CM

## 2016-03-23 DIAGNOSIS — H9190 Unspecified hearing loss, unspecified ear: Secondary | ICD-10-CM | POA: Diagnosis present

## 2016-03-23 DIAGNOSIS — Z515 Encounter for palliative care: Secondary | ICD-10-CM | POA: Diagnosis present

## 2016-03-23 DIAGNOSIS — Z87440 Personal history of urinary (tract) infections: Secondary | ICD-10-CM | POA: Diagnosis not present

## 2016-03-23 DIAGNOSIS — K5901 Slow transit constipation: Secondary | ICD-10-CM

## 2016-03-23 DIAGNOSIS — Z7189 Other specified counseling: Secondary | ICD-10-CM

## 2016-03-23 DIAGNOSIS — Z888 Allergy status to other drugs, medicaments and biological substances status: Secondary | ICD-10-CM

## 2016-03-23 DIAGNOSIS — E039 Hypothyroidism, unspecified: Secondary | ICD-10-CM | POA: Diagnosis present

## 2016-03-23 DIAGNOSIS — G9341 Metabolic encephalopathy: Secondary | ICD-10-CM

## 2016-03-23 DIAGNOSIS — R262 Difficulty in walking, not elsewhere classified: Secondary | ICD-10-CM

## 2016-03-23 DIAGNOSIS — S72144A Nondisplaced intertrochanteric fracture of right femur, initial encounter for closed fracture: Secondary | ICD-10-CM | POA: Diagnosis present

## 2016-03-23 DIAGNOSIS — R011 Cardiac murmur, unspecified: Secondary | ICD-10-CM | POA: Diagnosis not present

## 2016-03-23 DIAGNOSIS — Z66 Do not resuscitate: Secondary | ICD-10-CM | POA: Diagnosis present

## 2016-03-23 DIAGNOSIS — S72141D Displaced intertrochanteric fracture of right femur, subsequent encounter for closed fracture with routine healing: Secondary | ICD-10-CM | POA: Diagnosis not present

## 2016-03-23 DIAGNOSIS — F0390 Unspecified dementia without behavioral disturbance: Secondary | ICD-10-CM | POA: Diagnosis present

## 2016-03-23 DIAGNOSIS — Y9289 Other specified places as the place of occurrence of the external cause: Secondary | ICD-10-CM | POA: Diagnosis not present

## 2016-03-23 LAB — CBC WITH DIFFERENTIAL/PLATELET
BASOS ABS: 0 10*3/uL (ref 0.0–0.1)
BASOS PCT: 0 %
EOS ABS: 0 10*3/uL (ref 0.0–0.7)
EOS PCT: 0 %
HCT: 36.1 % (ref 36.0–46.0)
HEMOGLOBIN: 12.4 g/dL (ref 12.0–15.0)
Lymphocytes Relative: 10 %
Lymphs Abs: 1 10*3/uL (ref 0.7–4.0)
MCH: 31.9 pg (ref 26.0–34.0)
MCHC: 34.3 g/dL (ref 30.0–36.0)
MCV: 92.8 fL (ref 78.0–100.0)
Monocytes Absolute: 0.8 10*3/uL (ref 0.1–1.0)
Monocytes Relative: 9 %
NEUTROS PCT: 81 %
Neutro Abs: 7.7 10*3/uL (ref 1.7–7.7)
PLATELETS: 173 10*3/uL (ref 150–400)
RBC: 3.89 MIL/uL (ref 3.87–5.11)
RDW: 13.3 % (ref 11.5–15.5)
WBC: 9.6 10*3/uL (ref 4.0–10.5)

## 2016-03-23 LAB — TYPE AND SCREEN
ABO/RH(D): B POS
ANTIBODY SCREEN: NEGATIVE

## 2016-03-23 LAB — COMPREHENSIVE METABOLIC PANEL
ALBUMIN: 3.7 g/dL (ref 3.5–5.0)
ALT: 17 U/L (ref 14–54)
AST: 25 U/L (ref 15–41)
Alkaline Phosphatase: 70 U/L (ref 38–126)
Anion gap: 10 (ref 5–15)
BUN: 16 mg/dL (ref 6–20)
CHLORIDE: 98 mmol/L — AB (ref 101–111)
CO2: 23 mmol/L (ref 22–32)
CREATININE: 1.04 mg/dL — AB (ref 0.44–1.00)
Calcium: 9.4 mg/dL (ref 8.9–10.3)
GFR calc non Af Amer: 44 mL/min — ABNORMAL LOW (ref >60)
GFR, EST AFRICAN AMERICAN: 50 mL/min — AB (ref >60)
GLUCOSE: 149 mg/dL — AB (ref 65–99)
Potassium: 4.4 mmol/L (ref 3.5–5.1)
SODIUM: 131 mmol/L — AB (ref 135–145)
Total Bilirubin: 0.8 mg/dL (ref 0.3–1.2)
Total Protein: 6.3 g/dL — ABNORMAL LOW (ref 6.5–8.1)

## 2016-03-23 LAB — TROPONIN I
Troponin I: <0.03 ng/mL (ref <0.03)
Troponin I: <0.03 ng/mL (ref <0.03)

## 2016-03-23 LAB — ABO/RH: ABO/RH(D): B POS

## 2016-03-23 LAB — LIPASE, BLOOD: Lipase: 35 U/L (ref 11–51)

## 2016-03-23 MED ORDER — HYDROCODONE-ACETAMINOPHEN 5-325 MG PO TABS
1.0000 | ORAL_TABLET | Freq: Four times a day (QID) | ORAL | Status: DC | PRN
  Administered 2016-03-23 – 2016-03-26 (×4): 2 via ORAL
  Filled 2016-03-23 (×4): qty 2

## 2016-03-23 MED ORDER — LEVOTHYROXINE SODIUM 50 MCG PO TABS
50.0000 ug | ORAL_TABLET | Freq: Every day | ORAL | Status: DC
  Administered 2016-03-24 – 2016-03-26 (×2): 50 ug via ORAL
  Filled 2016-03-23 (×3): qty 1

## 2016-03-23 MED ORDER — SODIUM CHLORIDE 0.9 % IV SOLN
INTRAVENOUS | Status: DC
  Administered 2016-03-23 – 2016-03-24 (×2): via INTRAVENOUS

## 2016-03-23 MED ORDER — CARVEDILOL 3.125 MG PO TABS
3.1250 mg | ORAL_TABLET | Freq: Two times a day (BID) | ORAL | Status: DC
  Administered 2016-03-23 – 2016-03-26 (×5): 3.125 mg via ORAL
  Filled 2016-03-23 (×6): qty 1

## 2016-03-23 MED ORDER — MORPHINE SULFATE (PF) 4 MG/ML IV SOLN
0.5000 mg | INTRAVENOUS | Status: DC | PRN
  Administered 2016-03-23 – 2016-03-24 (×3): 0.52 mg via INTRAVENOUS
  Filled 2016-03-23 (×3): qty 1

## 2016-03-23 MED ORDER — SENNA 8.6 MG PO TABS
1.0000 | ORAL_TABLET | Freq: Every day | ORAL | Status: DC
  Filled 2016-03-23: qty 1

## 2016-03-23 MED ORDER — FENTANYL CITRATE (PF) 100 MCG/2ML IJ SOLN
100.0000 ug | Freq: Once | INTRAMUSCULAR | Status: AC
  Administered 2016-03-23: 100 ug via INTRAVENOUS
  Filled 2016-03-23: qty 2

## 2016-03-23 MED ORDER — LORAZEPAM 2 MG/ML IJ SOLN
0.5000 mg | Freq: Four times a day (QID) | INTRAMUSCULAR | Status: DC | PRN
Start: 2016-03-23 — End: 2016-03-26
  Administered 2016-03-24 – 2016-03-26 (×2): 0.5 mg via INTRAVENOUS
  Filled 2016-03-23 (×2): qty 1

## 2016-03-23 MED ORDER — ASPIRIN EC 81 MG PO TBEC
81.0000 mg | DELAYED_RELEASE_TABLET | Freq: Every day | ORAL | Status: DC
  Administered 2016-03-23 – 2016-03-26 (×4): 81 mg via ORAL
  Filled 2016-03-23 (×4): qty 1

## 2016-03-23 NOTE — ED Notes (Signed)
Pt agitated and trying to get out of bed. Pt c/o having to use the bathroom. Pt placed on bedpan multiple times and unable to void. Pt has long bone fracture in right leg and cannot roll or lift hips up. Notified Dr. David StallFeliz Ortiz and received order to place foley catheter. Peri care perform before insertion. After insertion pt resting on stretcher and less agitated. Family at bedside.

## 2016-03-23 NOTE — ED Notes (Signed)
Cardiology at bedside.

## 2016-03-23 NOTE — ED Notes (Signed)
Placed pt on bed pan.

## 2016-03-23 NOTE — ED Notes (Signed)
Patient transported to X-ray 

## 2016-03-23 NOTE — Consult Note (Signed)
Patient ID: [name] [name] MRN: [id number], DOB/AGE: 08-03-1916   Admit date: 03/23/2016   Reason for Consult: pre-operative risk assessment/ surgical clearance.  Anticipated Surgery: orthopedic (INTRAMEDULLARY (IM) NAIL FEMORAL) Requesting MD: Dr. Linna Caprice, [facility]   Primary Physician: [name], MD Primary Cardiologist: New (Dr. Elease Hashimoto)  Pt. Profile:  82 y/o female with h/o AAA s/p repair in 2002, aortic stenosis and HTN admitted with right intertrochanteric femur fracture in the setting of an unwittnessed fall. Cardiology consulted for pre-operative risk assessment/ surgical clearance.   Problem List  Past Medical History:  Diagnosis Date  . AAA (abdominal aortic aneurysm) (HCC)   . Alcoholism (HCC)   . Anxiety   . Hypertension   . Hypothyroidism   . Hypothyroidism   . Neuralgia   . OBS (organic brain syndrome)   . Trigeminal neuralgia   . UTI (lower urinary tract infection)     Past Surgical History:  Procedure Laterality Date  . ABDOMINAL ANGIOGRAM    . ABDOMINAL AORTIC ANEURYSM REPAIR  12/14/2000  . ABDOMINAL HYSTERECTOMY    . BRAIN SURGERY     janetta procedure     Allergies  Allergies  Allergen Reactions  . Dilantin [Phenytoin Sodium Extended] Other (See Comments)    On MAR  . Penicillins Other (See Comments)    On MAR  . Tegretol [Carbamazepine] Other (See Comments)    On MAR    HPI  82 y/o female with h/o AAA s/p repair in 2002 by Dr. Arbie Cookey, mild to moderate AS by echo in 2011 and h/o HTN admitted with right intertrochanteric femur fracture in the setting of an unwittnessed fall out of bed at her ALF. The patient and her daughter want to proceed with surgical stabilization of the hip fracture to prevent her from becoming bedridden. They understand that she has in excess of 30% risk of mortality within the next year. Surgery is tentatively scheduled for 03/24/16 at 3pm. Cardiology consulted for pre-operative risk assessment/  surgical clearance.   Chemical DVT prophylaxis is currently on hold. Hgb is stable at 12.4. Na 131. K 4.4. Scr 1.04. BUN 16. Troponin negative x 2. Her pain is controlled. VSS.   Of note, echocardiogram in 2011 showed normal LVEF and wall motion. EF was 60-65%. She was noted to have mild to moderate AS at that time with reported valve area of 1.26 cm2 and mean gradient of 13 mmHg.  F/u 2D echo pending. There is no documented h/o coronary disease.    EKG shows NSR with RBBB and LFAB. No significant change from prior EKG in May 2017. She denies CP. No dyspnea.    Home Medications  Prior to Admission medications   Medication Sig Start Date End Date Taking? Authorizing Provider  acetaminophen (TYLENOL) 325 MG tablet Take 650 mg by mouth every 4 (four) hours as needed for headache.   Yes Historical Provider, MD  aspirin 81 MG tablet Take 81 mg by mouth daily.   Yes Historical Provider, MD  carvedilol (COREG) 3.125 MG tablet Take 3.125 mg by mouth 2 (two) times daily with a meal.   Yes Historical Provider, MD  docusate sodium (COLACE) 100 MG capsule Take 100 mg by mouth at bedtime.   Yes Historical Provider, MD  hydrochlorothiazide (HYDRODIURIL) 12.5 MG tablet Take 12.5 mg by mouth 3 (three) times a week. Monday, Wednesday and Fridays.   Yes Historical Provider, MD  levothyroxine (SYNTHROID, LEVOTHROID) 50 MCG tablet Take 50 mcg by mouth daily before breakfast.  Yes Historical Provider, MD  lisinopril (PRINIVIL,ZESTRIL) 40 MG tablet Take 20 mg by mouth daily.    Yes Historical Provider, MD  magnesium hydroxide (MILK OF MAGNESIA) 400 MG/5ML suspension Take 30 mLs by mouth daily as needed for mild constipation.   Yes Historical Provider, MD  Melatonin 3 MG TABS Take 3 mg by mouth at bedtime.   Yes Historical Provider, MD  psyllium (REGULOID) 0.52 g capsule Take 0.52 g by mouth daily.   Yes Historical Provider, MD  vitamin B-12 (CYANOCOBALAMIN) 1000 MCG tablet Take 1,000 mcg by mouth daily.   Yes  Historical Provider, MD  Vitamin D, Ergocalciferol, (DRISDOL) 50000 units CAPS capsule Take 50,000 Units by mouth every 7 (seven) days.   Yes Historical Provider, MD   Hospital Medicines  . aspirin EC  81 mg Oral Daily  . carvedilol  3.125 mg Oral BID WC  . [START ON 03/24/2016] levothyroxine  50 mcg Oral QAC breakfast   Family History  Family History  Problem Relation Age of Onset  . Other Brother     coronary artery disaease    Social History  Social History   Social History  . Marital status: Widowed    Spouse name: N/A  . Number of children: N/A  . Years of education: N/A   Occupational History  . Not on file.   Social History Main Topics  . Smoking status: Former Smoker    Types: Cigarettes    Quit date: 02/19/1962  . Smokeless tobacco: Never Used  . Alcohol use 4.2 oz/week    7 Glasses of wine per week  . Drug use: No  . Sexual activity: Not on file   Other Topics Concern  . Not on file   Social History Narrative  . No narrative on file     Review of Systems General:  No chills, fever, night sweats or weight changes.  Cardiovascular:  No chest pain, dyspnea on exertion, edema, orthopnea, palpitations, paroxysmal nocturnal dyspnea. Dermatological: No rash, lesions/masses Respiratory: No cough, dyspnea Urologic: No hematuria, dysuria Abdominal:   No nausea, vomiting, diarrhea, bright red blood per rectum, melena, or hematemesis Neurologic:  No visual changes, wkns, changes in mental status. All other systems reviewed and are otherwise negative except as noted above.  Physical Exam  Blood pressure 162/69, pulse 70, temperature 97.9 F (36.6 C), temperature source Oral, resp. rate 22, SpO2 96 %.  General: Pleasant, NAD, elderly , hard of hearing  Psych: Normal affect. Neuro: Alert and oriented X 3. Moves all extremities spontaneously. HEENT: Normal  Neck: radiation of carotid murmur bilaterally  Lungs:  Resp regular and unlabored, CTA  anteriorlly. Heart: RRR 3/6 AS murmur heard throughout the precordium with radiation to the carotids bilaterally Abdomen: Soft, non-tender, non-distended, BS + x 4.  Extremities: No clubbing, cyanosis or edema. Radials 2+ and equal bilaterally. Weak right DP.   Labs  Troponin (Point of Care Test) No results for input(s): TROPIPOC in the last 72 hours.  Recent Labs  03/23/16 0502  TROPONINI <0.03   Lab Results  Component Value Date   WBC 9.6 03/23/2016   HGB 12.4 03/23/2016   HCT 36.1 03/23/2016   MCV 92.8 03/23/2016   PLT 173 03/23/2016    Recent Labs Lab 03/23/16 0502  NA 131*  K 4.4  CL 98*  CO2 23  BUN 16  CREATININE 1.04*  CALCIUM 9.4  PROT 6.3*  BILITOT 0.8  ALKPHOS 70  ALT 17  AST 25  GLUCOSE 149*  Lab Results  Component Value Date   CHOL (H) 05/23/2009    203        ATP III CLASSIFICATION:  <200     mg/dL   Desirable  960-454200-239  mg/dL   Borderline High  >=098>=240    mg/dL   High          HDL 119106 05/23/2009   LDLCALC  05/23/2009    86        Total Cholesterol/HDL:CHD Risk Coronary Heart Disease Risk Table                     Men   Women  1/2 Average Risk   3.4   3.3  Average Risk       5.0   4.4  2 X Average Risk   9.6   7.1  3 X Average Risk  23.4   11.0        Use the calculated Patient Ratio above and the CHD Risk Table to determine the patient's CHD Risk.        ATP III CLASSIFICATION (LDL):  <100     mg/dL   Optimal  147-829100-129  mg/dL   Near or Above                    Optimal  130-159  mg/dL   Borderline  562-130160-189  mg/dL   High  >865>190     mg/dL   Very High   TRIG 56 78/46/962902/20/2011   No results found for: DDIMER   Radiology/Studies  Dg Chest 2 View  Result Date: 03/23/2016 CLINICAL DATA:  Preop evaluation. EXAM: CHEST  2 VIEW COMPARISON:  Prior radiograph from 08/15/2015. FINDINGS: Patient is rotated. Allowing for rotation, cardiomegaly is stable. Mediastinal silhouette within normal limits. Aortic atherosclerosis noted. Lungs mildly  hypoinflated. Mild pulmonary vascular congestion and interstitial prominence without overt pulmonary edema. No pleural effusion. No pneumothorax. No focal infiltrates identified. Diffuse osteopenia. No acute osseous abnormality. Aortic stent endograft noted. Scoliosis noted. IMPRESSION: 1. Stable cardiomegaly with mild diffuse pulmonary interstitial congestion without overt pulmonary edema. 2. Aortic atherosclerosis. Electronically Signed   By: Rise MuBenjamin  McClintock M.D.   On: 03/23/2016 05:43   Dg Knee 1-2 Views Right  Result Date: 03/23/2016 CLINICAL DATA:  82 year old female with fall and right femoral neck fracture. EXAM: RIGHT KNEE - 1-2 VIEW COMPARISON:  None. FINDINGS: There is no acute fracture or dislocation. The bones are osteopenic. There is mild meniscal chondrocalcinosis. There is no joint effusion. The soft tissues appear unremarkable. Vascular calcification noted. IMPRESSION: No acute fracture or dislocation. Osteopenia. Electronically Signed   By: Elgie CollardArash  Radparvar M.D.   On: 03/23/2016 07:01   Dg Hip Unilat W Or Wo Pelvis 2-3 Views Right  Result Date: 03/23/2016 CLINICAL DATA:  82 year old female with fall and right hip deformity. EXAM: DG HIP (WITH OR WITHOUT PELVIS) 2-3V RIGHT COMPARISON:  CT of the abdomen pelvis dated 08/15/2015 FINDINGS: There is comminuted appearing intertrochanteric fracture of the right femur with mild valgus angulation. No other acute fracture identified. There is no dislocation. The bones are osteopenic. An aorta bi-iliac endovascular stent graft repair noted. There is moderate stool throughout the colon. The soft tissues are grossly unremarkable. IMPRESSION: Comminuted appearing intratrochanteric fracture of the right femur with mild valgus angulation. No dislocation. Electronically Signed   By: Elgie CollardArash  Radparvar M.D.   On: 03/23/2016 05:43    ECG  NSR. RBBB, LFAB- unchanged from prior EKG May 2017  Echocardiogram - pending    ASSESSMENT AND PLAN  1.  Right intertrochanteric femur fracture in the setting of unwitnessed fall: The patient and her daughter want to proceed with surgical stabilization of the hip fracture to prevent her from becoming bedridden. They understand that she has in excess of 30% risk of mortality within the next year. Surgery is tentatively scheduled for 03/24/16. No anemia. Pain is controlled with analgesics.   2. Aortic Stenosis: noted to have mild to moderate AS by echo in 2011. Valve area was 1.26 cm2 and mean gradient of 13 mmHg. Notable murmur heard throughout the precordium on exam with radiation to the carotids bilaterally.  F/u 2D echo pending.   3. H/o AAA: s/p surgical repair in 2002. Was followed by Dr. Arbie CookeyEarly.   4. Pre-operative Risk Assessment:  Pt's risk is high given age of 82 however, from an orthopedic standpoint, she has a  >  30% risk of mortality within the next year w/o surgery. She has commodities that also pose risk including aortic stenosis that has not been reassessed in over 6 years. She has a loud murmur radiating to her carotids. Will obtain 2D echo today to reassess. Will determine clearance after echo is resulted.   Signed, Robbie LisBrittainy Simmons, PA-C 03/23/2016, 12:52 PM  Attending Note:   The patient was seen and examined.  Agree with assessment and plan as noted above.  Changes made to the above note as needed.  Patient seen and independently examined with Robbie LisBrittainy Simmons, PA-C.   We discussed all aspects of the encounter. I agree with the assessment and plan as stated above.  1. Preoperative evaluation prior to hip surgery. His Denton LankGriffith has a history of mild aortic stenosis. On exam the aortic stenosis is still likely mild or perhaps moderate. She has  good distal pulses. She does not have any signs or symptoms of congestive heart failure. We will be getting an echocardiogram for further assessment but I feel sure that she will be able to have her hip fixed without excessive cardiovascular  risk.  2. Aortic stenosis: Her aortic stenosis does not sound SEVERE. She has good distal pulses and I suspect that her aortic valve gradient has not changed much since her previous echocardiogram in 2011. She will be getting an echocardiogram today.  3. Status post abdominal aortic aneurysm repair: This is stable.   I have spent a total of 40 minutes with patient reviewing hospital  notes , telemetry, EKGs, labs and examining patient as well as establishing an assessment and plan that was discussed with the patient. > 50% of time was spent in direct patient care.    Vesta MixerPhilip J. Jaevian Shean, Montez HagemanJr., MD, [facility] 03/23/2016, 1:55 PM [address],  [address] Office [phone]- [phone] Pager [phone]

## 2016-03-23 NOTE — ED Triage Notes (Signed)
Pt BIB EMS from [facility].  Staff heard pt fall earlier and it appears she was attempting to go to the bathroom.  Found on her left side trying to drag herself back to the bed.  Complaints at that time of right side pain.  Staff stood the pt up and attempted to ambulate her to the restroom and changed her clothes.  Sat patient in wheelchair and called her daughter.  Daughter advised staff to hold off on calling EMS until she could get there.  Pt's daughter arrived some time after and EMS was called.  Obvious shortening and outward rotation noted to right leg.  Positive pulses. No other complaints. History of organic brain disease.  Pt is hard of hearing.

## 2016-03-23 NOTE — Consult Note (Signed)
ORTHOPAEDIC CONSULTATION  REQUESTING PHYSICIAN: Marinda Elk, MD  PCP:  [name], MD  Chief Complaint: right intertrochanteric femur fracture  HPI: [name] [name] is a 82 y.o. female who resides in an assisted living facility. Patient is extremely hard of hearing. The history was obtained from her daughter who is a retired Engineer, civil (consulting). Per the patient's daughter, the patient was found down in her bathroom yesterday. She is a in the room ambulator, basically ambulates from her wheelchair to the bed or to the commode. She propels around her facility using her feet in a wheelchair.  Past Medical History:  Diagnosis Date  . AAA (abdominal aortic aneurysm) (HCC)   . Alcoholism (HCC)   . Anxiety   . Hypertension   . Hypothyroidism   . Hypothyroidism   . Neuralgia   . OBS (organic brain syndrome)   . Trigeminal neuralgia   . UTI (lower urinary tract infection)    Past Surgical History:  Procedure Laterality Date  . ABDOMINAL ANGIOGRAM    . ABDOMINAL AORTIC ANEURYSM REPAIR  12/14/2000  . ABDOMINAL HYSTERECTOMY    . BRAIN SURGERY     janetta procedure   Social History   Social History  . Marital status: Widowed    Spouse name: N/A  . Number of children: N/A  . Years of education: N/A   Social History Main Topics  . Smoking status: Former Smoker    Types: Cigarettes    Quit date: 02/19/1962  . Smokeless tobacco: Never Used  . Alcohol use 4.2 oz/week    7 Glasses of wine per week  . Drug use: No  . Sexual activity: Not Asked   Other Topics Concern  . None   Social History Narrative  . None   Family History  Problem Relation Age of Onset  . Other Brother     coronary artery disaease   Allergies  Allergen Reactions  . Dilantin [Phenytoin Sodium Extended] Other (See Comments)    On MAR  . Penicillins Other (See Comments)    On MAR  . Tegretol [Carbamazepine] Other (See Comments)    On MAR   Prior to Admission medications   Medication Sig Start Date  End Date Taking? Authorizing Provider  acetaminophen (TYLENOL) 325 MG tablet Take 650 mg by mouth every 4 (four) hours as needed for headache.   Yes Historical Provider, MD  aspirin 81 MG tablet Take 81 mg by mouth daily.   Yes Historical Provider, MD  carvedilol (COREG) 3.125 MG tablet Take 3.125 mg by mouth 2 (two) times daily with a meal.   Yes Historical Provider, MD  docusate sodium (COLACE) 100 MG capsule Take 100 mg by mouth at bedtime.   Yes Historical Provider, MD  hydrochlorothiazide (HYDRODIURIL) 12.5 MG tablet Take 12.5 mg by mouth 3 (three) times a week. Monday, Wednesday and Fridays.   Yes Historical Provider, MD  levothyroxine (SYNTHROID, LEVOTHROID) 50 MCG tablet Take 50 mcg by mouth daily before breakfast.   Yes Historical Provider, MD  lisinopril (PRINIVIL,ZESTRIL) 40 MG tablet Take 20 mg by mouth daily.    Yes Historical Provider, MD  magnesium hydroxide (MILK OF MAGNESIA) 400 MG/5ML suspension Take 30 mLs by mouth daily as needed for mild constipation.   Yes Historical Provider, MD  Melatonin 3 MG TABS Take 3 mg by mouth at bedtime.   Yes Historical Provider, MD  psyllium (REGULOID) 0.52 g capsule Take 0.52 g by mouth daily.   Yes Historical Provider, MD  vitamin  B-12 (CYANOCOBALAMIN) 1000 MCG tablet Take 1,000 mcg by mouth daily.   Yes Historical Provider, MD  Vitamin D, Ergocalciferol, (DRISDOL) 50000 units CAPS capsule Take 50,000 Units by mouth every 7 (seven) days.   Yes Historical Provider, MD   Dg Chest 2 View  Result Date: 03/23/2016 CLINICAL DATA:  Preop evaluation. EXAM: CHEST  2 VIEW COMPARISON:  Prior radiograph from 08/15/2015. FINDINGS: Patient is rotated. Allowing for rotation, cardiomegaly is stable. Mediastinal silhouette within normal limits. Aortic atherosclerosis noted. Lungs mildly hypoinflated. Mild pulmonary vascular congestion and interstitial prominence without overt pulmonary edema. No pleural effusion. No pneumothorax. No focal infiltrates identified.  Diffuse osteopenia. No acute osseous abnormality. Aortic stent endograft noted. Scoliosis noted. IMPRESSION: 1. Stable cardiomegaly with mild diffuse pulmonary interstitial congestion without overt pulmonary edema. 2. Aortic atherosclerosis. Electronically Signed   By: Rise MuBenjamin  McClintock M.D.   On: 03/23/2016 05:43   Dg Knee 1-2 Views Right  Result Date: 03/23/2016 CLINICAL DATA:  82 year old female with fall and right femoral neck fracture. EXAM: RIGHT KNEE - 1-2 VIEW COMPARISON:  None. FINDINGS: There is no acute fracture or dislocation. The bones are osteopenic. There is mild meniscal chondrocalcinosis. There is no joint effusion. The soft tissues appear unremarkable. Vascular calcification noted. IMPRESSION: No acute fracture or dislocation. Osteopenia. Electronically Signed   By: Elgie CollardArash  Radparvar M.D.   On: 03/23/2016 07:01   Dg Hip Unilat W Or Wo Pelvis 2-3 Views Right  Result Date: 03/23/2016 CLINICAL DATA:  [age] year old female with fall and right hip deformity. EXAM: DG HIP (WITH OR WITHOUT PELVIS) 2-3V RIGHT COMPARISON:  CT of the abdomen pelvis dated 08/15/2015 FINDINGS: There is comminuted appearing intertrochanteric fracture of the right femur with mild valgus angulation. No other acute fracture identified. There is no dislocation. The bones are osteopenic. An aorta bi-iliac endovascular stent graft repair noted. There is moderate stool throughout the colon. The soft tissues are grossly unremarkable. IMPRESSION: Comminuted appearing intratrochanteric fracture of the right femur with mild valgus angulation. No dislocation. Electronically Signed   By: Elgie CollardArash  Radparvar M.D.   On: 03/23/2016 05:43    Positive ROS: All other systems have been reviewed and were otherwise negative with the exception of those mentioned in the HPI and as above.  Physical Exam: General: Alert, no acute distress Cardiovascular: No pedal edema Respiratory: No cyanosis, no use of accessory musculature GI: No  organomegaly, abdomen is soft and non-tender Skin: No lesions in the area of chief complaint Neurologic: Sensation intact distally Psychiatric: Patient is competent for consent with normal mood and affect Lymphatic: No axillary or cervical lymphadenopathy  MUSCULOSKELETAL: examination of the right lower extremity reveals no skin wounds or lesions over the hip. The extremity is shortened and externally rotated. She has pain with attempted logrolling of the hip. She is able to wiggle her toes. She has a  1+ pedal pulse. Sensation is intact to light touch  Assessment: Multiple medical problems Displaced right intertrochanteric femur fracture  Plan: I discussed the findings with the patient and her daughter. Unfortunately this is a difficult situation. Per the daughter's history, she has had some cardiac issues in the past. She indicates that the hospitalist recommends 2-D echo as well as cardiology consult for preoperative stratification and medical optimization. We did discuss the risks, benefits, and alternatives to surgical fixation of her right hip. Please see statement of risk. The patient and her daughter want to proceed with surgical stabilization of the hip fracture prevent her from becoming bedridden. They understand that  she has in excess of 30% risk of mortality within the next year. We will tentatively plan for surgery tomorrow pending cardiology evaluation. Hold chemical DVT prophylaxis. Nothing by mouth after midnight.   The risks, benefits, and alternatives were discussed with the patient. There are risks associated with the surgery including, but not limited to, problems with anesthesia (death), infection, differences in leg length/angulation/rotation, fracture of bones, loosening or failure of implants, malunion, nonunion, hematoma (blood accumulation) which may require surgical drainage, blood clots, pulmonary embolism, nerve injury (foot drop), and blood vessel injury. The patient  understands these risks and elects to proceed.   Donaven Criswell, Cloyde ReamsBrian James, MD Cell [phone]    03/23/2016 7:49 AM

## 2016-03-23 NOTE — ED Notes (Signed)
Took pt off of bedpan, pt did not void.

## 2016-03-23 NOTE — ED Provider Notes (Signed)
[facility]-EMERGENCY DEPT Provider Note   CSN: [id number] Arrival date & time: 03/23/16  [id number]     History   Chief Complaint Chief Complaint  Patient presents with  . Fall    HPI [name] [name] is a 82 y.o. female. Past medical history of AAA, hypertension, presenting today after a fall. This fall was unwitnessed. Patient landed on the right hip.  She has pain and swelling in this area as well. She cannot give any further history. There are no further complaints.  10 Systems reviewed and are negative for acute change except as noted in the HPI.    HPI  Past Medical History:  Diagnosis Date  . AAA (abdominal aortic aneurysm) (HCC)   . Alcoholism (HCC)   . Anxiety   . Hypertension   . Hypothyroidism   . Hypothyroidism   . Neuralgia   . OBS (organic brain syndrome)   . Trigeminal neuralgia   . UTI (lower urinary tract infection)     Patient Active Problem List   Diagnosis Date Noted  . AAA (abdominal aortic aneurysm) (HCC) 02/20/2012    Past Surgical History:  Procedure Laterality Date  . ABDOMINAL ANGIOGRAM    . ABDOMINAL AORTIC ANEURYSM REPAIR  12/14/2000  . ABDOMINAL HYSTERECTOMY    . BRAIN SURGERY     janetta procedure    OB History    No data available       Home Medications    Prior to Admission medications   Medication Sig Start Date End Date Taking? Authorizing Provider  acetaminophen (TYLENOL) 325 MG tablet Take 650 mg by mouth every 4 (four) hours as needed for headache.    Historical Provider, MD  aspirin 81 MG tablet Take 81 mg by mouth daily.    Historical Provider, MD  carvedilol (COREG) 3.125 MG tablet Take 3.125 mg by mouth 2 (two) times daily with a meal.    Historical Provider, MD  cholecalciferol (VITAMIN D) 400 units TABS tablet Take 400 Units by mouth daily.    Historical Provider, MD  docusate sodium (COLACE) 100 MG capsule Take 100 mg by mouth at bedtime.    Historical Provider, MD  hydrochlorothiazide (HYDRODIURIL) 12.5 MG tablet Take  12.5 mg by mouth 3 (three) times a week. Monday, Wednesday and Fridays.    Historical Provider, MD  levothyroxine (SYNTHROID, LEVOTHROID) 50 MCG tablet Take 50 mcg by mouth daily before breakfast.    Historical Provider, MD  lisinopril (PRINIVIL,ZESTRIL) 40 MG tablet Take 22.5 mg by mouth daily.     Historical Provider, MD  magnesium hydroxide (MILK OF MAGNESIA) 400 MG/5ML suspension Take 30 mLs by mouth daily as needed for mild constipation.    Historical Provider, MD  Melatonin 3 MG TABS Take 3 mg by mouth at bedtime.    Historical Provider, MD  psyllium (REGULOID) 0.52 g capsule Take 0.52 g by mouth daily.    Historical Provider, MD  traMADol (ULTRAM) 50 MG tablet Take 50 mg by mouth every 8 (eight) hours as needed for moderate pain.    Historical Provider, MD  vitamin B-12 (CYANOCOBALAMIN) 1000 MCG tablet Take 1,000 mcg by mouth daily.    Historical Provider, MD    Family History Family History  Problem Relation Age of Onset  . Other Brother     coronary artery disaease    Social History Social History  Substance Use Topics  . Smoking status: Former Smoker    Types: Cigarettes    Quit date: 02/19/1962  . Smokeless  tobacco: Never Used  . Alcohol use 4.2 oz/week    7 Glasses of wine per week     Allergies   Dilantin [phenytoin sodium extended]; Penicillins; and Tegretol [carbamazepine]   Review of Systems Review of Systems   Physical Exam Updated Vital Signs BP 154/67   Pulse 69   Temp 97.9 F (36.6 C) (Oral)   Resp 19   SpO2 98%   Physical Exam  Constitutional: She is oriented to person, place, and time. She appears well-developed and well-nourished. No distress.  HENT:  Head: Normocephalic and atraumatic.  Nose: Nose normal.  Mouth/Throat: Oropharynx is clear and moist. No oropharyngeal exudate.  Eyes: Conjunctivae and EOM are normal. Pupils are equal, round, and reactive to light. No scleral icterus.  Neck: Normal range of motion. Neck supple. No JVD present.  No tracheal deviation present. No thyromegaly present.  Cardiovascular: Normal rate, regular rhythm and normal heart sounds.  Exam reveals no gallop and no friction rub.   No murmur heard. Pulmonary/Chest: Effort normal and breath sounds normal. No respiratory distress. She has no wheezes. She exhibits no tenderness.  Abdominal: Soft. Bowel sounds are normal. She exhibits no distension and no mass. There is no tenderness. There is no rebound and no guarding.  Musculoskeletal: Normal range of motion. She exhibits deformity. She exhibits no edema or tenderness.  Obvious deformity to the right hip with tenderness to palpation. Leg is shortened and externally rotated. 2+ pulses in the dorsalis pedis.  Lymphadenopathy:    She has no cervical adenopathy.  Neurological: She is alert and oriented to person, place, and time. No cranial nerve deficit. She exhibits normal muscle tone.  Skin: Skin is warm and dry. No rash noted. No erythema. No pallor.  Nursing note and vitals reviewed.    ED Treatments / Results  Labs (all labs ordered are listed, but only abnormal results are displayed) Labs Reviewed  CBC WITH DIFFERENTIAL/PLATELET  COMPREHENSIVE METABOLIC PANEL  LIPASE, BLOOD    EKG  EKG Interpretation  Date/Time:  Thursday March 23 2016 05:01:56 EST Ventricular Rate:  71 PR Interval:    QRS Duration: 133 QT Interval:  443 QTC Calculation: 482 R Axis:   -63 Text Interpretation:  Sinus rhythm Prolonged PR interval RBBB and LAFB No significant change since last tracing Confirmed by Erroll Lunani, Riely Baskett Ayokunle [id number]) on 03/23/2016 5:09:30 AM       Radiology Dg Chest 2 View  Result Date: 03/23/2016 CLINICAL DATA:  Preop evaluation. EXAM: CHEST  2 VIEW COMPARISON:  Prior radiograph from 08/15/2015. FINDINGS: Patient is rotated. Allowing for rotation, cardiomegaly is stable. Mediastinal silhouette within normal limits. Aortic atherosclerosis noted. Lungs mildly hypoinflated. Mild pulmonary  vascular congestion and interstitial prominence without overt pulmonary edema. No pleural effusion. No pneumothorax. No focal infiltrates identified. Diffuse osteopenia. No acute osseous abnormality. Aortic stent endograft noted. Scoliosis noted. IMPRESSION: 1. Stable cardiomegaly with mild diffuse pulmonary interstitial congestion without overt pulmonary edema. 2. Aortic atherosclerosis. Electronically Signed   By: Rise MuBenjamin  McClintock M.D.   On: 03/23/2016 05:43   Dg Hip Unilat W Or Wo Pelvis 2-3 Views Right  Result Date: 03/23/2016 CLINICAL DATA:  82 year old female with fall and right hip deformity. EXAM: DG HIP (WITH OR WITHOUT PELVIS) 2-3V RIGHT COMPARISON:  CT of the abdomen pelvis dated 08/15/2015 FINDINGS: There is comminuted appearing intertrochanteric fracture of the right femur with mild valgus angulation. No other acute fracture identified. There is no dislocation. The bones are osteopenic. An aorta bi-iliac endovascular stent graft  repair noted. There is moderate stool throughout the colon. The soft tissues are grossly unremarkable. IMPRESSION: Comminuted appearing intratrochanteric fracture of the right femur with mild valgus angulation. No dislocation. Electronically Signed   By: Elgie Collard M.D.   On: 03/23/2016 05:43    Procedures Procedures (including critical care time)  Medications Ordered in ED Medications  fentaNYL (SUBLIMAZE) injection 100 mcg (100 mcg Intravenous Given 03/23/16 0542)     Initial Impression / Assessment and Plan / ED Course  I have reviewed the triage vital signs and the nursing notes.  Pertinent labs & imaging results that were available during my care of the patient were reviewed by me and considered in my medical decision making (see chart for details).  Clinical Course    Patient presents to the emergency department after a fall. Likely fracture versus dislocation. Will obtain x-ray for evaluation. Patient given fentanyl for pain  control.  5:53 AM upon repeat evaluation, pain has improved. I spoke with Dr. Linna Caprice with orthopedic surgery who recommends for hospitalist admission and he will see her in the morning. X-ray does reveal a right intertrochanteric fracture. We will page hospitalist for further care.  Final Clinical Impressions(s) / ED Diagnoses   Final diagnoses:  None    New Prescriptions New Prescriptions   No medications on file     Tomasita Crumble, MD 03/23/16 [phone]

## 2016-03-23 NOTE — Consult Note (Signed)
Consultation Note Date: 03/23/2016   Patient Name: [name] [name]  DOB: 1917/01/20  MRN: [id number]  Age / Sex: 82 y.o., female  PCP: Florentina JennyHenry Tripp, MD Referring Physician: Marinda ElkAbraham Feliz Ortiz, MD  Reason for Consultation: Establishing goals of care  HPI/Patient Profile: 82 y.o. female  with past medical history of AAA (2.7 x 3.8) AS, anxiety, trigeminal neuralgia, h/o ETOH (currently drinks 8 oz of wine each evening) who was admitted on 03/23/2016 with a right femoral neck fracture. She has been evaluated by cardiology and orthopedic surgery.  Her daughter Avon Gully(HCPOA) understands she is high risk, but supports proceeding with the surgery to repair her fracture.  Clinical Assessment and Goals of Care: I spoke with the patient and her daughter [name] at bedside.  The patient is lethargic due to receiving vicodin recently.  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  She was also valedictorian of her high school class.  [name] tells me her mother is happy at [facility] and has a good quality of life.  She is in the wheelchair most of the time using her own legs to propel her around, but she was also able to stand and pivot.  She was eating fairly well and able to communicate clearly.  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  Previously [name] filled out a MOST form, but as it is not in EPIC, we filled another one out today.  Her mother is a DNR, COMFORT MEASURES ONLY, Antibiotics if indicated, IVF if indicated, NO feeding tube.    The only reason her mother returned to the hospital today is for hip repair.  She and [name] are hopeful that she will return to being able to scoot around in her wheel chair at [facility].  We discussed constipation and alcohol history as well.  Primary Decision  Maker:  HCPOA: Daughter [name]    SUMMARY OF RECOMMENDATIONS    MOST form completed.  DNR CMO. Patient and HCPOA support going forward with surgery.  Code Status/Advance Care Planning:  DNR    Symptom Management:   Ativan PRN agitation  Senna QHS as patient has baseline constipation (but is incontinent of bowel) and is now on opioid meds.  Palliative Prophylaxis:   Bowel Regimen and Delirium Protocol   Psycho-social/Spiritual:   Desire for further Chaplaincy support:no  Additional Recommendations: Caregiving  Support/Resources  Prognosis:   < 12 months.  At 89 any surgery is going to be a significant risk.  Discharge Planning: To Be Determined      Primary Diagnoses: Present on Admission: . Intertrochanteric fracture (HCC) . Essential hypertension . Hypothyroidism, acquired   I have reviewed the medical record, interviewed the patient and family, and examined the patient. The following aspects are pertinent.  Past Medical History:  Diagnosis Date  . AAA (abdominal aortic aneurysm) (HCC)   .  Alcoholism (HCC)   . Anxiety   . Hypertension   . Hypothyroidism   . Hypothyroidism   . Neuralgia   . OBS (organic brain syndrome)   . Trigeminal neuralgia   . UTI (lower urinary tract infection)    Social History   Social History  . Marital status: Widowed    Spouse name: N/A  . Number of children: N/A  . Years of education: N/A   Social History Main Topics  . Smoking status: Former Smoker    Types: Cigarettes    Quit date: 02/19/1962  . Smokeless tobacco: Never Used  . Alcohol use 4.2 oz/week    7 Glasses of wine per week  . Drug use: No  . Sexual activity: Not Asked   Other Topics Concern  . None   Social History Narrative  . None   Family History  Problem Relation Age of Onset  . Other Brother     coronary artery disaease   Scheduled Meds: . aspirin EC  81 mg Oral Daily  . carvedilol  3.125 mg Oral BID WC  . [START ON  03/24/2016] levothyroxine  50 mcg Oral QAC breakfast  . senna  1 tablet Oral QHS   Continuous Infusions: . sodium chloride 125 mL/hr at 03/23/16 1420   PRN Meds:.HYDROcodone-acetaminophen, morphine injection Allergies  Allergen Reactions  . Dilantin [Phenytoin Sodium Extended] Other (See Comments)    On MAR  . Penicillins Other (See Comments)    On MAR  . Tegretol [Carbamazepine] Other (See Comments)    On MAR   Review of Systems patient lethargic due to pain medication.  Physical Exam  Constitutional: She appears well-developed.  Frail, elderly thin, edentulous  HENT:  Head: Normocephalic and atraumatic.  Neck: Neck supple.  Cardiovascular: Normal rate.   Murmur heard. Pulmonary/Chest: Effort normal and breath sounds normal.  Abdominal: Soft. Bowel sounds are normal.  Musculoskeletal: She exhibits edema and deformity.  Neurological:  Very sleepy due to pain medication.  Skin: Skin is warm and dry.  Psychiatric:  Awakens to voice.  Unable to assess otherwise.    Vital Signs: BP (!) 171/90 (BP Location: Left Arm)   Pulse 94   Temp 99.7 F (37.6 C)   Resp 18   SpO2 100%          SpO2: SpO2: 100 % O2 Device:SpO2: 100 % O2 Flow Rate: .   IO: Intake/output summary:  Intake/Output Summary (Last 24 hours) at 03/23/16 1617 Last data filed at 03/23/16 1333  Gross per 24 hour  Intake                0 ml  Output              700 ml  Net             -700 ml    LBM:   Baseline Weight:   Most recent weight:       Palliative Assessment/Data:     Time In: 3:30 Time Out: 4:46 Time Total: 76 Greater than 50%  of this time was spent counseling and coordinating care related to the above assessment and plan.  Signed by: Algis DownsMarianne York, PA-C Palliative Medicine Pager: [phone]  Please contact Palliative Medicine Team phone at [phone] for questions and concerns.  For individual provider: See Loretha StaplerAmion

## 2016-03-23 NOTE — H&P (Deleted)
History and Physical  Patient Name: [name] [name]     ZOX:[id number]    DOB: 1916-07-30    DOA: 03/23/2016 Referring provider: Dereck Leep, MD PCP: [name], MD    Patient coming from: Assisted Living     Chief Complaint: Hip pain and fall  HPI: [name] [name] is a modest demure 82 y.o. female with a past medical history significant for HTN, hypothyroidism and AAA s/p endorepair who presents with hip pain after a fall.  The patient was in her normal state of health until this evening when she had an unwitnessed fall and was found on the floor.  Staff attempted to stand her up but she fell again, so EMS was called.    In the ED, a plain radiograph of the RIGHT hip showed a comminuted intratrochanteric fracture.  The case was discussed with Dr. Veda Canning who agreed to see the patient, and TR[facility] were asked to admit for medical management.    Caveat that the patient is currently altered and all history obtained from daughter at the bedside.  There is no report of active heart issues, the ECG shows sinus tachycardia, CXR shows no edema, and she has no history of complaints of chest pain, palpitations, angina that daughter knows of.  Never had an MI, but did have troponin 0.21 ng/mL in May when she was here for a fall, and patient and family declined workup.  Patient has no significant dementia and lives in assisted living.  She is relatively sedentary, uses a walker.  No history of TIA, stroke, diabetes, CHF.     Review of Systems:  Review of Systems  Unable to perform ROS: Mental status change          Past Medical History:  Diagnosis Date  . AAA (abdominal aortic aneurysm) (HCC)   . Alcoholism (HCC)   . Anxiety   . Hypertension   . Hypothyroidism   . Hypothyroidism   . Neuralgia   . OBS (organic brain syndrome)   . Trigeminal neuralgia   . UTI (lower urinary tract infection)     Past Surgical History:  Procedure Laterality Date  . ABDOMINAL ANGIOGRAM    . ABDOMINAL  AORTIC ANEURYSM REPAIR  12/14/2000  . ABDOMINAL HYSTERECTOMY    . BRAIN SURGERY     janetta procedure    Social History: Patient lives at assisted living facility.  Patient walks with a walker.  Has no significant dementia.  From [place]. [place].  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  Former smoker, remote.    Allergies  Allergen Reactions  . Dilantin [Phenytoin Sodium Extended] Other (See Comments)    On MAR  . Penicillins Other (See Comments)    On MAR  . Tegretol [Carbamazepine] Other (See Comments)    On MAR    Family history: TIA in brother  Prior to Admission medications   Medication Sig Start Date End Date Taking? Authorizing Provider  acetaminophen (TYLENOL) 325 MG tablet Take 650 mg by mouth every 4 (four) hours as needed for headache.    Historical Provider, MD  aspirin 81 MG tablet Take 81 mg by mouth daily.    Historical Provider, MD  carvedilol (COREG) 3.125 MG tablet Take 3.125 mg by mouth 2 (two) times daily with a meal.    Historical Provider, MD  cholecalciferol (VITAMIN D) 400 units TABS tablet Take 400 Units by mouth daily.    Historical Provider, MD  docusate sodium (COLACE) 100 MG capsule  Take 100 mg by mouth at bedtime.    Historical Provider, MD  hydrochlorothiazide (HYDRODIURIL) 12.5 MG tablet Take 12.5 mg by mouth 3 (three) times a week. Monday, Wednesday and Fridays.    Historical Provider, MD  levothyroxine (SYNTHROID, LEVOTHROID) 50 MCG tablet Take 50 mcg by mouth daily before breakfast.    Historical Provider, MD  lisinopril (PRINIVIL,ZESTRIL) 40 MG tablet Take 22.5 mg by mouth daily.     Historical Provider, MD  magnesium hydroxide (MILK OF MAGNESIA) 400 MG/5ML suspension Take 30 mLs by mouth daily as needed for mild constipation.    Historical Provider, MD  Melatonin 3 MG TABS Take 3 mg by mouth at bedtime.    Historical Provider, MD  psyllium (REGULOID) 0.52 g capsule Take 0.52 g by mouth daily.    Historical Provider, MD    traMADol (ULTRAM) 50 MG tablet Take 50 mg by mouth every 8 (eight) hours as needed for moderate pain.    Historical Provider, MD  vitamin B-12 (CYANOCOBALAMIN) 1000 MCG tablet Take 1,000 mcg by mouth daily.    Historical Provider, MD       Physical Exam: BP 154/85   Pulse 72   Temp 97.9 F (36.6 C) (Oral)   Resp 24   SpO2 96%  General appearance: Frail elderly adult female, eyes open but only intermittently responsive to stimuli or following commands.  Listless.  Eyes: Anicteric, conjunctiva pink, lids and lashes normal. Pupils small, sluggishly reactive. ENT: No nasal deformity, discharge, epistaxis.  Hearing deaf. OP dry.  Edentulous.   Lymph: No cervical, supraclavicular or axillary lymphadenopathy. Skin: Warm and dry.  No jaundice.  No suspicious rashes or lesions. Cardiac: Tachycardic, gallop present.  Capillary refill is brisk.  No JVD. No LE edema.  Radial and DP pulses 2+ and symmetric.   Respiratory: Normal respiratory rate and rhythm.  Rales bilaterally. GI: Abdomen soft without rigidity.  No ascites, distension, no hepatosplenomegaly.  MSK: Right thigh swollen, knee bent.  No effusions.  No clubbing or cyanosis. Neuro: Only intermittently following commands.  Does not make eye contact.  Lifts arms bilaterally, hand grip symmetric.  Face symmetric.  Patient makes no utterances. Psych: Unable to assess.    Labs on Admission:  I have personally reviewed following labs and imaging studies: CBC:  Recent Labs Lab 03/23/16 0502  WBC 9.6  NEUTROABS 7.7  HGB 12.4  HCT 36.1  MCV 92.8  PLT 173   Basic Metabolic Panel:  Recent Labs Lab 03/23/16 0502  NA 131*  K 4.4  CL 98*  CO2 23  GLUCOSE 149*  BUN 16  CREATININE 1.04*  CALCIUM 9.4   GFR: CrCl cannot be calculated (Unknown ideal weight.).  Liver Function Tests:  Recent Labs Lab 03/23/16 0502  AST 25  ALT 17  ALKPHOS 70  BILITOT 0.8  PROT 6.3*  ALBUMIN 3.7    Recent Labs Lab 03/23/16 0502   LIPASE 35   No results for input(s): AMMONIA in the last 168 hours. Coagulation Profile: No results for input(s): INR, PROTIME in the last 168 hours. Cardiac Enzymes: No results for input(s): CKTOTAL, CKMB, CKMBINDEX, TROPONINI in the last 168 hours. BNP (last 3 results) No results for input(s): PROBNP in the last 8760 hours. HbA1C: No results for input(s): HGBA1C in the last 72 hours. CBG: No results for input(s): GLUCAP in the last 168 hours. Lipid Profile: No results for input(s): CHOL, HDL, LDLCALC, TRIG, CHOLHDL, LDLDIRECT in the last 72 hours. Thyroid Function Tests: No results  for input(s): TSH, T4TOTAL, FREET4, T3FREE, THYROIDAB in the last 72 hours. Anemia Panel: No results for input(s): VITAMINB12, FOLATE, FERRITIN, TIBC, IRON, RETICCTPCT in the last 72 hours.    Radiological Exams on Admission: Personally reviewed CXR shows interstitial markings without frank edema: Dg Chest 2 View  Result Date: 03/23/2016 CLINICAL DATA:  Preop evaluation. EXAM: CHEST  2 VIEW COMPARISON:  Prior radiograph from 08/15/2015. FINDINGS: Patient is rotated. Allowing for rotation, cardiomegaly is stable. Mediastinal silhouette within normal limits. Aortic atherosclerosis noted. Lungs mildly hypoinflated. Mild pulmonary vascular congestion and interstitial prominence without overt pulmonary edema. No pleural effusion. No pneumothorax. No focal infiltrates identified. Diffuse osteopenia. No acute osseous abnormality. Aortic stent endograft noted. Scoliosis noted. IMPRESSION: 1. Stable cardiomegaly with mild diffuse pulmonary interstitial congestion without overt pulmonary edema. 2. Aortic atherosclerosis. Electronically Signed   By: Rise MuBenjamin  McClintock M.D.   On: 03/23/2016 05:43   Dg Hip Unilat W Or Wo Pelvis 2-3 Views Right  Result Date: 03/23/2016 CLINICAL DATA:  82 year old female with fall and right hip deformity. EXAM: DG HIP (WITH OR WITHOUT PELVIS) 2-3V RIGHT COMPARISON:  CT of the  abdomen pelvis dated 08/15/2015 FINDINGS: There is comminuted appearing intertrochanteric fracture of the right femur with mild valgus angulation. No other acute fracture identified. There is no dislocation. The bones are osteopenic. An aorta bi-iliac endovascular stent graft repair noted. There is moderate stool throughout the colon. The soft tissues are grossly unremarkable. IMPRESSION: Comminuted appearing intratrochanteric fracture of the right femur with mild valgus angulation. No dislocation. Electronically Signed   By: Elgie CollardArash  Radparvar M.D.   On: 03/23/2016 05:43    EKG: Independently reviewed. Rate 71, QTc 482, RBBB and LAFB, similar to previous.    Assessment and Plan: 1. Hip fracture: The patient will be [facility]Swintek [facility] to evaluate for operative fixation of the RIGHT hip.    Risk stratification is difficult because of her sedentary functional status, but she is undoubtedly high risk from a CV perspective alone.  Furthermore, she appears altered at present, unclear if this is delirium from her fracture or effect of fentanyl.   -Echocardiogram ordered -Troponin ordered -Consult to Palliative Care  -Admit to med-surg bed -Hydrocodone-acetaminophen or morphine as tolerated for pain -Bed rest, apply ice, document sedation and vitals per Hip fracture protocol -NPO  -MIVF -Hold following meds:  -lisinopril and HCTZ       2. HTN: -Continue BB and aspirin -Hold ACEi -Hold HCTZ for now  3. Hypothyroidism: -Continue levothyroxine  4. Hyponatremia: -Monitor fluid status -Trend BMP       DVT prophylaxis: SCDs for now  Diet: NPO Code Status: DO NOT RESUSCITATE  Family Communication: Daughter at bedside  Disposition Plan: Anticipate evaluation by Orthopedics  Admission status: INPATIENT for hip fracture, medical surgical bed     Medical decision making and consults: Patient seen at 6:27 AM on 03/23/2016.  The patient was discussed with Dr.  Mora Bellmanni. What exists of the patient's chart was reviewed in depth and summarized above.  Clinical condition: guarded.      Alberteen SamChristopher P Danford [facility] Pager [phone]

## 2016-03-23 NOTE — H&P (Signed)
History and Physical  Patient Name: [name] [name]     ZOX:[id number]    DOB: [birth year]    DOA: 03/23/2016 Referring provider: Dereck LeepAdeleki Oni, MD PCP: Florentina JennyRIPP, HENRY, MD    Patient coming from: Assisted Living     Chief Complaint: Hip pain and fall  HPI: [name] [name] is a modest demure 82 y.o. female with a past medical history significant for HTN, hypothyroidism and AAA s/p endorepair who presents with hip pain after a fall.  The patient was in her normal state of health until this evening when she had an unwitnessed fall and was found on the floor.  Staff attempted to stand her up but she fell again, so EMS was called.    In the ED, a plain radiograph of the RIGHT hip showed a comminuted intratrochanteric fracture.  The case was discussed with Dr. Veda CanningSwintek who agreed to see the patient, and [facility] were asked to admit for medical management.    Caveat that the patient is currently altered and all history obtained from daughter at the bedside.  There is no report of active heart issues, the ECG shows sinus tachycardia, CXR shows no edema, and she has no history of complaints of chest pain, palpitations, angina that daughter knows of.  Never had an MI, but did have troponin 0.21 ng/mL in May when she was here for a fall, and patient and family declined workup.  Patient has no significant dementia and lives in assisted living.  She is relatively sedentary, uses a walker.  No history of TIA, stroke, diabetes, CHF.     Review of Systems:  Review of Systems  Unable to perform ROS: Mental status change          Past Medical History:  Diagnosis Date  . AAA (abdominal aortic aneurysm) (HCC)   . Alcoholism (HCC)   . Anxiety   . Hypertension   . Hypothyroidism   . Hypothyroidism   . Neuralgia   . OBS (organic brain syndrome)   . Trigeminal neuralgia   . UTI (lower urinary tract infection)     Past Surgical History:  Procedure Laterality Date  . ABDOMINAL ANGIOGRAM    . ABDOMINAL  AORTIC ANEURYSM REPAIR  12/14/2000  . ABDOMINAL HYSTERECTOMY    . BRAIN SURGERY     janetta procedure  . CATARACT EXTRACTION    . COLONOSCOPY      Social History: Patient lives at assisted living facility.  Patient walks with a walker.  Has no significant dementia.  [place]  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  Former smoker, remote.    Allergies  Allergen Reactions  . Dilantin [Phenytoin Sodium Extended] Other (See Comments)    On MAR  . Penicillins Other (See Comments)    On MAR  . Tegretol [Carbamazepine] Other (See Comments)    On MAR    Family history: TIA in brother  Prior to Admission medications   Medication Sig Start Date End Date Taking? Authorizing Provider  acetaminophen (TYLENOL) 325 MG tablet Take 650 mg by mouth every 4 (four) hours as needed for headache.    Historical Provider, MD  aspirin 81 MG tablet Take 81 mg by mouth daily.    Historical Provider, MD  carvedilol (COREG) 3.125 MG tablet Take 3.125 mg by mouth 2 (two) times daily with a meal.    Historical Provider, MD  cholecalciferol (VITAMIN D) 400 units TABS tablet Take 400 Units by mouth daily.  Historical Provider, MD  docusate sodium (COLACE) 100 MG capsule Take 100 mg by mouth at bedtime.    Historical Provider, MD  hydrochlorothiazide (HYDRODIURIL) 12.5 MG tablet Take 12.5 mg by mouth 3 (three) times a week. Monday, Wednesday and Fridays.    Historical Provider, MD  levothyroxine (SYNTHROID, LEVOTHROID) 50 MCG tablet Take 50 mcg by mouth daily before breakfast.    Historical Provider, MD  lisinopril (PRINIVIL,ZESTRIL) 40 MG tablet Take 22.5 mg by mouth daily.     Historical Provider, MD  magnesium hydroxide (MILK OF MAGNESIA) 400 MG/5ML suspension Take 30 mLs by mouth daily as needed for mild constipation.    Historical Provider, MD  Melatonin 3 MG TABS Take 3 mg by mouth at bedtime.    Historical Provider, MD  psyllium (REGULOID) 0.52 g capsule Take 0.52 g by mouth  daily.    Historical Provider, MD  traMADol (ULTRAM) 50 MG tablet Take 50 mg by mouth every 8 (eight) hours as needed for moderate pain.    Historical Provider, MD  vitamin B-12 (CYANOCOBALAMIN) 1000 MCG tablet Take 1,000 mcg by mouth daily.    Historical Provider, MD       Physical Exam: BP (!) 177/71   Pulse 84   Temp 99.7 F (37.6 C)   Resp 18   SpO2 100%  General appearance: Frail elderly adult female, eyes open but only intermittently responsive to stimuli or following commands.  Listless.  Eyes: Anicteric, conjunctiva pink, lids and lashes normal. Pupils small, sluggishly reactive. ENT: No nasal deformity, discharge, epistaxis.  Hearing deaf. OP dry.  Edentulous.   Lymph: No cervical, supraclavicular or axillary lymphadenopathy. Skin: Warm and dry.  No jaundice.  No suspicious rashes or lesions. Cardiac: Tachycardic, gallop present.  Capillary refill is brisk.  No JVD. No LE edema.  Radial and DP pulses 2+ and symmetric.   Respiratory: Normal respiratory rate and rhythm.  Rales bilaterally. GI: Abdomen soft without rigidity.  No ascites, distension, no hepatosplenomegaly.  MSK: Right thigh swollen, knee bent.  No effusions.  No clubbing or cyanosis. Neuro: Only intermittently following commands.  Does not make eye contact.  Lifts arms bilaterally, hand grip symmetric.  Face symmetric.  Patient makes no utterances. Psych: Unable to assess.    Labs on Admission:  I have personally reviewed following labs and imaging studies: CBC:  Recent Labs Lab 03/23/16 0502  WBC 9.6  NEUTROABS 7.7  HGB 12.4  HCT 36.1  MCV 92.8  PLT 173   Basic Metabolic Panel:  Recent Labs Lab 03/23/16 0502  NA 131*  K 4.4  CL 98*  CO2 23  GLUCOSE 149*  BUN 16  CREATININE 1.04*  CALCIUM 9.4   GFR: CrCl cannot be calculated (Unknown ideal weight.).  Liver Function Tests:  Recent Labs Lab 03/23/16 0502  AST 25  ALT 17  ALKPHOS 70  BILITOT 0.8  PROT 6.3*  ALBUMIN 3.7     Recent Labs Lab 03/23/16 0502  LIPASE 35   No results for input(s): AMMONIA in the last 168 hours. Coagulation Profile: No results for input(s): INR, PROTIME in the last 168 hours. Cardiac Enzymes:  Recent Labs Lab 03/23/16 0502 03/23/16 1224 03/23/16 1839  TROPONINI <0.03 <0.03 <0.03   BNP (last 3 results) No results for input(s): PROBNP in the last 8760 hours. HbA1C: No results for input(s): HGBA1C in the last 72 hours. CBG: No results for input(s): GLUCAP in the last 168 hours. Lipid Profile: No results for input(s): CHOL, HDL, LDLCALC,  TRIG, CHOLHDL, LDLDIRECT in the last 72 hours. Thyroid Function Tests: No results for input(s): TSH, T4TOTAL, FREET4, T3FREE, THYROIDAB in the last 72 hours. Anemia Panel: No results for input(s): VITAMINB12, FOLATE, FERRITIN, TIBC, IRON, RETICCTPCT in the last 72 hours.    Radiological Exams on Admission: Personally reviewed CXR shows interstitial markings without frank edema: Dg Chest 2 View  Result Date: 03/23/2016 CLINICAL DATA:  Preop evaluation. EXAM: CHEST  2 VIEW COMPARISON:  Prior radiograph from 08/15/2015. FINDINGS: Patient is rotated. Allowing for rotation, cardiomegaly is stable. Mediastinal silhouette within normal limits. Aortic atherosclerosis noted. Lungs mildly hypoinflated. Mild pulmonary vascular congestion and interstitial prominence without overt pulmonary edema. No pleural effusion. No pneumothorax. No focal infiltrates identified. Diffuse osteopenia. No acute osseous abnormality. Aortic stent endograft noted. Scoliosis noted. IMPRESSION: 1. Stable cardiomegaly with mild diffuse pulmonary interstitial congestion without overt pulmonary edema. 2. Aortic atherosclerosis. Electronically Signed   By: Rise Mu M.D.   On: 03/23/2016 05:43   Dg Knee 1-2 Views Right  Result Date: 03/23/2016 CLINICAL DATA:  82 year old female with fall and right femoral neck fracture. EXAM: RIGHT KNEE - 1-2 VIEW COMPARISON:   None. FINDINGS: There is no acute fracture or dislocation. The bones are osteopenic. There is mild meniscal chondrocalcinosis. There is no joint effusion. The soft tissues appear unremarkable. Vascular calcification noted. IMPRESSION: No acute fracture or dislocation. Osteopenia. Electronically Signed   By: Elgie Collard M.D.   On: 03/23/2016 07:01   Dg Hip Unilat W Or Wo Pelvis 2-3 Views Right  Result Date: 03/23/2016 CLINICAL DATA:  83 year old female with fall and right hip deformity. EXAM: DG HIP (WITH OR WITHOUT PELVIS) 2-3V RIGHT COMPARISON:  CT of the abdomen pelvis dated 08/15/2015 FINDINGS: There is comminuted appearing intertrochanteric fracture of the right femur with mild valgus angulation. No other acute fracture identified. There is no dislocation. The bones are osteopenic. An aorta bi-iliac endovascular stent graft repair noted. There is moderate stool throughout the colon. The soft tissues are grossly unremarkable. IMPRESSION: Comminuted appearing intratrochanteric fracture of the right femur with mild valgus angulation. No dislocation. Electronically Signed   By: Elgie Collard M.D.   On: 03/23/2016 05:43    EKG: Independently reviewed. Rate 71, QTc 482, RBBB and LAFB, similar to previous.    Assessment and Plan: 1. Hip fracture: The patient will be seen by Dr. Veda Canning in the morning at [facility], to evaluate for operative fixation of the RIGHT hip.    Risk stratification is difficult because of her sedentary functional status, but she is undoubtedly high risk from a CV perspective alone.  Furthermore, she appears altered at present, unclear if this is delirium from her fracture or effect of fentanyl.   -Echocardiogram ordered -Troponin ordered -Consult to Palliative Care  -Admit to med-surg bed -Hydrocodone-acetaminophen or morphine as tolerated for pain -Bed rest, apply ice, document sedation and vitals per Hip fracture protocol -NPO  -MIVF -Hold following  meds:  -lisinopril and HCTZ       2. HTN: -Continue BB and aspirin -Hold ACEi -Hold HCTZ for now  3. Hypothyroidism: -Continue levothyroxine  4. Hyponatremia: -Monitor fluid status -Trend BMP       DVT prophylaxis: SCDs for now  Diet: NPO Code Status: DO NOT RESUSCITATE  Family Communication: Daughter at bedside  Disposition Plan: Anticipate evaluation by Orthopedics  Admission status: INPATIENT for hip fracture, medical surgical bed     Medical decision making and consults: Patient seen at 7:48 PM on 03/23/2016.  The patient  was discussed with Dr. Mora Bellmanni. What exists of the patient's chart was reviewed in depth and summarized above.  Clinical condition: guarded.      Alberteen SamChristopher P Cristin Szatkowski [facility] Pager [phone]

## 2016-03-23 NOTE — Care Management Note (Signed)
Case Management Note  Patient Details  Name: [name] [name] MRN: [id number] Date of Birth: 02-18-1917  Subjective/Objective:                  y/o female with h/o AAA s/p repair in 2002, aortic stenosis and HTN admitted with right intertrochanteric femur fracture in the setting of an unwittnessed fall. /From [facility]  Action/Plan: Follow for disposition needs. /Admit to INPATIENT, return to ALF at discharge.   Expected Discharge Date:  03/26/16               Expected Discharge Plan:  Assisted Living / Rest Home  In-House Referral:  Clinical Social Work  Discharge planning Services  NA  Post Acute Care Choice:  NA  Status of Service:  In process, will continue to follow    Additional Comments:  Oletta CohnWood, Cleo Villamizar, RN 03/23/2016, 1:51 PM

## 2016-03-24 ENCOUNTER — Encounter (HOSPITAL_COMMUNITY)
Admission: EM | Disposition: A | Discharge status: Skilled Nursing Facility | Payer: Self-pay | Source: Home / Self Care | Attending: Internal Medicine

## 2016-03-24 ENCOUNTER — Inpatient Hospital Stay (HOSPITAL_COMMUNITY): Payer: Medicare Other | Admitting: Anesthesiology

## 2016-03-24 ENCOUNTER — Inpatient Hospital Stay (HOSPITAL_COMMUNITY): Payer: Medicare Other

## 2016-03-24 DIAGNOSIS — R011 Cardiac murmur, unspecified: Secondary | ICD-10-CM

## 2016-03-24 DIAGNOSIS — I350 Nonrheumatic aortic (valve) stenosis: Secondary | ICD-10-CM

## 2016-03-24 DIAGNOSIS — Z8679 Personal history of other diseases of the circulatory system: Secondary | ICD-10-CM

## 2016-03-24 DIAGNOSIS — G459 Transient cerebral ischemic attack, unspecified: Secondary | ICD-10-CM

## 2016-03-24 DIAGNOSIS — G9341 Metabolic encephalopathy: Secondary | ICD-10-CM

## 2016-03-24 DIAGNOSIS — S72141A Displaced intertrochanteric fracture of right femur, initial encounter for closed fracture: Secondary | ICD-10-CM | POA: Diagnosis present

## 2016-03-24 HISTORY — PX: FEMUR IM NAIL: SHX1597

## 2016-03-24 LAB — CBC
HCT: 27 % — ABNORMAL LOW (ref 36.0–46.0)
Hemoglobin: 9 g/dL — ABNORMAL LOW (ref 12.0–15.0)
MCH: 31.5 pg (ref 26.0–34.0)
MCHC: 33.3 g/dL (ref 30.0–36.0)
MCV: 94.4 fL (ref 78.0–100.0)
PLATELETS: 127 10*3/uL — AB (ref 150–400)
RBC: 2.86 MIL/uL — AB (ref 3.87–5.11)
RDW: 13.4 % (ref 11.5–15.5)
WBC: 8.8 10*3/uL (ref 4.0–10.5)

## 2016-03-24 LAB — ECHOCARDIOGRAM COMPLETE

## 2016-03-24 LAB — SURGICAL PCR SCREEN
MRSA, PCR: NEGATIVE
STAPHYLOCOCCUS AUREUS: NEGATIVE

## 2016-03-24 LAB — CREATININE, SERUM
CREATININE: 0.88 mg/dL (ref 0.44–1.00)
GFR calc Af Amer: >60 mL/min (ref >60)
GFR calc non Af Amer: 53 mL/min — ABNORMAL LOW (ref >60)

## 2016-03-24 SURGERY — INSERTION, INTRAMEDULLARY ROD, FEMUR
Anesthesia: General | Laterality: Right

## 2016-03-24 MED ORDER — ONDANSETRON HCL 4 MG/2ML IJ SOLN
INTRAMUSCULAR | Status: DC | PRN
  Administered 2016-03-24: 4 mg via INTRAVENOUS

## 2016-03-24 MED ORDER — METOCLOPRAMIDE HCL 5 MG/ML IJ SOLN: 5.0000 mg | Freq: Three times a day (TID) | INTRAMUSCULAR | Status: DC | PRN

## 2016-03-24 MED ORDER — ACETAMINOPHEN 650 MG RE SUPP: 650.0000 mg | Freq: Four times a day (QID) | RECTAL | Status: DC | PRN

## 2016-03-24 MED ORDER — FENTANYL CITRATE (PF) 100 MCG/2ML IJ SOLN
INTRAMUSCULAR | Status: AC
  Filled 2016-03-24: qty 2

## 2016-03-24 MED ORDER — FENTANYL CITRATE (PF) 100 MCG/2ML IJ SOLN
INTRAMUSCULAR | Status: DC | PRN
  Administered 2016-03-24: 75 ug via INTRAVENOUS
  Administered 2016-03-24: 25 ug via INTRAVENOUS

## 2016-03-24 MED ORDER — PHENOL 1.4 % MT LIQD: 1.0000 | OROMUCOSAL | Status: DC | PRN

## 2016-03-24 MED ORDER — ACETAMINOPHEN 10 MG/ML IV SOLN
INTRAVENOUS | Status: DC | PRN
  Administered 2016-03-24: 1000 mg via INTRAVENOUS

## 2016-03-24 MED ORDER — ACETAMINOPHEN 10 MG/ML IV SOLN
INTRAVENOUS | Status: AC
  Filled 2016-03-24: qty 100

## 2016-03-24 MED ORDER — PROPOFOL 10 MG/ML IV BOLUS
INTRAVENOUS | Status: DC | PRN
  Administered 2016-03-24: 50 mg via INTRAVENOUS

## 2016-03-24 MED ORDER — ONDANSETRON HCL 4 MG/2ML IJ SOLN
INTRAMUSCULAR | Status: AC
  Filled 2016-03-24: qty 2

## 2016-03-24 MED ORDER — PHENYLEPHRINE 40 MCG/ML (10ML) SYRINGE FOR IV PUSH (FOR BLOOD PRESSURE SUPPORT)
PREFILLED_SYRINGE | INTRAVENOUS | Status: AC
  Filled 2016-03-24: qty 10

## 2016-03-24 MED ORDER — POVIDONE-IODINE 10 % EX SWAB
2.0000 "application " | Freq: Once | CUTANEOUS | Status: AC
  Administered 2016-03-24: 2 via TOPICAL

## 2016-03-24 MED ORDER — LACTATED RINGERS IV SOLN
INTRAVENOUS | Status: DC
Start: 2016-03-24 — End: 2016-03-24
  Administered 2016-03-24 (×2): via INTRAVENOUS

## 2016-03-24 MED ORDER — FENTANYL CITRATE (PF) 100 MCG/2ML IJ SOLN
25.0000 ug | INTRAMUSCULAR | Status: DC | PRN
  Administered 2016-03-24: 25 ug via INTRAVENOUS
  Administered 2016-03-24: 50 ug via INTRAVENOUS
  Administered 2016-03-24: 25 ug via INTRAVENOUS

## 2016-03-24 MED ORDER — CEFAZOLIN SODIUM 1 G IJ SOLR
INTRAMUSCULAR | Status: AC
  Filled 2016-03-24: qty 20

## 2016-03-24 MED ORDER — PROPOFOL 10 MG/ML IV BOLUS
INTRAVENOUS | Status: AC
  Filled 2016-03-24: qty 40

## 2016-03-24 MED ORDER — KETAMINE HCL-SODIUM CHLORIDE 100-0.9 MG/10ML-% IV SOSY
PREFILLED_SYRINGE | INTRAVENOUS | Status: AC
  Filled 2016-03-24: qty 10

## 2016-03-24 MED ORDER — FENTANYL CITRATE (PF) 100 MCG/2ML IJ SOLN
50.0000 ug | Freq: Once | INTRAMUSCULAR | Status: AC
  Administered 2016-03-24: 25 ug via INTRAVENOUS

## 2016-03-24 MED ORDER — KETAMINE HCL 10 MG/ML IJ SOLN
INTRAMUSCULAR | Status: DC | PRN
  Administered 2016-03-24: 30 mg via INTRAVENOUS

## 2016-03-24 MED ORDER — MENTHOL 3 MG MT LOZG: 1.0000 | LOZENGE | OROMUCOSAL | Status: DC | PRN

## 2016-03-24 MED ORDER — CLINDAMYCIN PHOSPHATE 600 MG/50ML IV SOLN
600.0000 mg | Freq: Four times a day (QID) | INTRAVENOUS | Status: AC
  Administered 2016-03-24 – 2016-03-25 (×2): 600 mg via INTRAVENOUS
  Filled 2016-03-24 (×2): qty 50

## 2016-03-24 MED ORDER — CEFAZOLIN SODIUM-DEXTROSE 2-3 GM-% IV SOLR
INTRAVENOUS | Status: DC | PRN
  Administered 2016-03-24: 2 g via INTRAVENOUS

## 2016-03-24 MED ORDER — METOCLOPRAMIDE HCL 5 MG PO TABS: 5.0000 mg | ORAL_TABLET | Freq: Three times a day (TID) | ORAL | Status: DC | PRN

## 2016-03-24 MED ORDER — ONDANSETRON HCL 4 MG/2ML IJ SOLN: 4.0000 mg | Freq: Four times a day (QID) | INTRAMUSCULAR | Status: DC | PRN

## 2016-03-24 MED ORDER — PHENYLEPHRINE HCL 10 MG/ML IJ SOLN
INTRAVENOUS | Status: DC | PRN
  Administered 2016-03-24: 40 ug/min via INTRAVENOUS

## 2016-03-24 MED ORDER — GLYCOPYRROLATE 0.2 MG/ML IJ SOLN
INTRAMUSCULAR | Status: DC | PRN
  Administered 2016-03-24: 0.2 mg via INTRAVENOUS

## 2016-03-24 MED ORDER — FENTANYL CITRATE (PF) 100 MCG/2ML IJ SOLN
INTRAMUSCULAR | Status: AC
  Administered 2016-03-24: 50 ug
  Filled 2016-03-24: qty 2

## 2016-03-24 MED ORDER — ONDANSETRON HCL 4 MG PO TABS: 4.0000 mg | ORAL_TABLET | Freq: Four times a day (QID) | ORAL | Status: DC | PRN

## 2016-03-24 MED ORDER — ENOXAPARIN SODIUM 30 MG/0.3ML ~~LOC~~ SOLN
30.0000 mg | SUBCUTANEOUS | Status: DC
  Administered 2016-03-25 – 2016-03-26 (×2): 30 mg via SUBCUTANEOUS
  Filled 2016-03-24 (×2): qty 0.3

## 2016-03-24 MED ORDER — 0.9 % SODIUM CHLORIDE (POUR BTL) OPTIME
TOPICAL | Status: DC | PRN
  Administered 2016-03-24: 1000 mL

## 2016-03-24 MED ORDER — CHLORHEXIDINE GLUCONATE 4 % EX LIQD
60.0000 mL | Freq: Once | CUTANEOUS | Status: AC
  Administered 2016-03-24: 4 via TOPICAL
  Filled 2016-03-24: qty 60

## 2016-03-24 MED ORDER — PHENYLEPHRINE HCL 10 MG/ML IJ SOLN
INTRAMUSCULAR | Status: DC | PRN
  Administered 2016-03-24 (×2): 80 ug via INTRAVENOUS

## 2016-03-24 MED ORDER — ACETAMINOPHEN 325 MG PO TABS
650.0000 mg | ORAL_TABLET | Freq: Four times a day (QID) | ORAL | Status: DC | PRN
  Administered 2016-03-26: 650 mg via ORAL
  Filled 2016-03-24: qty 2

## 2016-03-24 MED ORDER — VANCOMYCIN HCL IN DEXTROSE 1-5 GM/200ML-% IV SOLN: 1000.0000 mg | INTRAVENOUS | Status: DC

## 2016-03-24 SURGICAL SUPPLY — 62 items
ADH SKN CLS APL DERMABOND .7 (GAUZE/BANDAGES/DRESSINGS) ×2
ADH SKN CLS LQ APL DERMABOND (GAUZE/BANDAGES/DRESSINGS) ×2
ALCOHOL ISOPROPYL (RUBBING) (MISCELLANEOUS) ×3 IMPLANT
BIT DRILL CANN LG 4.3MM (BIT) IMPLANT
BNDG COHESIVE 4X5 TAN STRL (GAUZE/BANDAGES/DRESSINGS) ×3 IMPLANT
CHLORAPREP W/TINT 26ML (MISCELLANEOUS) ×3 IMPLANT
COVER PERINEAL POST (MISCELLANEOUS) ×3 IMPLANT
COVER SURGICAL LIGHT HANDLE (MISCELLANEOUS) ×3 IMPLANT
DERMABOND ADHESIVE PROPEN (GAUZE/BANDAGES/DRESSINGS) ×4
DERMABOND ADVANCED (GAUZE/BANDAGES/DRESSINGS) ×4
DERMABOND ADVANCED .7 DNX12 (GAUZE/BANDAGES/DRESSINGS) ×2 IMPLANT
DERMABOND ADVANCED .7 DNX6 (GAUZE/BANDAGES/DRESSINGS) IMPLANT
DRAPE C-ARM 42X72 X-RAY (DRAPES) ×3 IMPLANT
DRAPE C-ARMOR (DRAPES) ×3 IMPLANT
DRAPE IMP U-DRAPE 54X76 (DRAPES) ×6 IMPLANT
DRAPE STERI IOBAN 125X83 (DRAPES) ×3 IMPLANT
DRAPE U-SHAPE 47X51 STRL (DRAPES) ×6 IMPLANT
DRAPE UNIVERSAL PACK (DRAPES) ×3 IMPLANT
DRESSING AQUACEL AG SP 3.5X4 (GAUZE/BANDAGES/DRESSINGS) IMPLANT
DRILL BIT CANN LG 4.3MM (BIT) ×3
DRSG AQUACEL AG ADV 3.5X 6 (GAUZE/BANDAGES/DRESSINGS) ×2 IMPLANT
DRSG AQUACEL AG SP 3.5X4 (GAUZE/BANDAGES/DRESSINGS) ×3
DRSG MEPILEX BORDER 4X4 (GAUZE/BANDAGES/DRESSINGS) ×6 IMPLANT
DRSG PAD ABDOMINAL 8X10 ST (GAUZE/BANDAGES/DRESSINGS) ×3 IMPLANT
ELECT REM PT RETURN 9FT ADLT (ELECTROSURGICAL) ×3
ELECTRODE REM PT RTRN 9FT ADLT (ELECTROSURGICAL) ×1 IMPLANT
FACESHIELD WRAPAROUND (MASK) ×3 IMPLANT
FACESHIELD WRAPAROUND OR TEAM (MASK) ×1 IMPLANT
GLOVE BIO SURGEON STRL SZ 6.5 (GLOVE) ×2 IMPLANT
GLOVE BIO SURGEON STRL SZ7.5 (GLOVE) ×2 IMPLANT
GLOVE BIO SURGEON STRL SZ8.5 (GLOVE) ×6 IMPLANT
GLOVE BIO SURGEONS STRL SZ 6.5 (GLOVE) ×2
GLOVE BIOGEL PI IND STRL 6.5 (GLOVE) IMPLANT
GLOVE BIOGEL PI IND STRL 7.0 (GLOVE) IMPLANT
GLOVE BIOGEL PI IND STRL 8.5 (GLOVE) ×1 IMPLANT
GLOVE BIOGEL PI INDICATOR 6.5 (GLOVE) ×4
GLOVE BIOGEL PI INDICATOR 7.0 (GLOVE) ×2
GLOVE BIOGEL PI INDICATOR 8.5 (GLOVE) ×2
GOWN STRL REUS W/ TWL LRG LVL3 (GOWN DISPOSABLE) ×1 IMPLANT
GOWN STRL REUS W/TWL 2XL LVL3 (GOWN DISPOSABLE) ×3 IMPLANT
GOWN STRL REUS W/TWL LRG LVL3 (GOWN DISPOSABLE) ×3
GUIDEWIRE BALL NOSE 100CM (WIRE) ×2 IMPLANT
HIP FRA NAIL LAG SCREW 10.5X90 (Orthopedic Implant) ×3 IMPLANT
KIT ROOM TURNOVER OR (KITS) ×3 IMPLANT
MANIFOLD NEPTUNE II (INSTRUMENTS) ×3 IMPLANT
MARKER SKIN DUAL TIP RULER LAB (MISCELLANEOUS) ×3 IMPLANT
NAIL HIP FRACT 130D 11X180 (Screw) ×2 IMPLANT
NS IRRIG 1000ML POUR BTL (IV SOLUTION) ×3 IMPLANT
PACK GENERAL/GYN (CUSTOM PROCEDURE TRAY) ×3 IMPLANT
PAD ARMBOARD 7.5X6 YLW CONV (MISCELLANEOUS) ×6 IMPLANT
PADDING CAST ABS 4INX4YD NS (CAST SUPPLIES) ×2
PADDING CAST ABS COTTON 4X4 ST (CAST SUPPLIES) ×1 IMPLANT
PIN GUIDE 3.2 903003004 (MISCELLANEOUS) ×2 IMPLANT
SCREW BONE CORTICAL 5.0X32 (Screw) ×2 IMPLANT
SCREW LAG HIP FRA NAIL 10.5X90 (Orthopedic Implant) IMPLANT
SUT MNCRL AB 3-0 PS2 27 (SUTURE) ×3 IMPLANT
SUT MON AB 2-0 CT1 27 (SUTURE) ×3 IMPLANT
SUT MON AB 2-0 CT1 36 (SUTURE) ×2 IMPLANT
SUT VIC AB 1 CT1 27 (SUTURE) ×3
SUT VIC AB 1 CT1 27XBRD ANBCTR (SUTURE) ×1 IMPLANT
TOWEL OR 17X24 6PK STRL BLUE (TOWEL DISPOSABLE) ×3 IMPLANT
TOWEL OR 17X26 10 PK STRL BLUE (TOWEL DISPOSABLE) ×3 IMPLANT

## 2016-03-24 NOTE — Progress Notes (Signed)
PROGRESS NOTE    [name] [name]  ZOX:[id number] DOB: 03-21-1917 DOA: 03/23/2016 PCP: Florentina JennyRIPP, HENRY, MD   Brief Narrative:  [name] Evansrma C Rougeau is a 82 y.o. female with a past medical history significant for HTN, hypothyroidism and AAA s/p endorepair who presents with hip pain after a fall. The patient was in her normal state of health until yesterday evening when she had an unwitnessed fall and was found on the floor.  Staff attempted to stand her up but she fell again, so EMS was called. In the ED, a plain radiograph of the RIGHT hip showed a comminuted intratrochanteric fracture.  The case was discussed with Dr. Veda CanningSwintek who agreed to see the patient, and [facility] were asked to admit for medical management. Patient remains altered and Had Cardiac Evaluation and she was cleared for Surgery. She went to surgery this Afternoon.    Assessment & Plan:   Active Problems:   Closed nondisplaced intertrochanteric fracture of right femur (HCC)   Essential hypertension   Hypothyroidism, acquired   Palliative care encounter   Goals of care, counseling/discussion   Slow transit constipation  1. Displaced Right Intertrochanteric Femur Fracture from Unwitnessed Fall: -NPO for Procedure; Patient to be taken to Surgery by Dr. Linna CapriceSwinteck -Preoperative Cardiology Clearance appreciated. Seen By Dr. Melburn PopperNasher and Echocardiogram Ordered and done but waiting to be Read -Per Cardiology she is at Moderate Risk for Her Hip Repair due to Advanced Age and states Aortic Stenosis should not be an issue.  -IVF with NS at 125 mL/hr -Pain Control with Morphine IV q2hprn and Hydrocodone-Acetaminophen 1-2 tablet po q6hprn -Appreciate Orthopedics Dr. Linna CapriceSwinteck Evaluation and Further Reccs -Palliative Consulted and Appreciate Consultation- Daughter wishes to proceed with Surgery  -Continue to Monitor  2. HTN: -Continue ASA 81 mg po Daily and C/w Carvedilol 3.125 mg po BID -Continue to Hold ACE-I and HCTZ  3.  Hypothyroidism: -Continue Levothyroxine 50 mcg po Daily  4. Hyponatremia: -Patients Sodium was 131 -C/w NS at 125 mL/hr -Repeat CMP in AM  5. Aortic Stenosis -ECHOcardiogram done but not read -Cardiology Dr. Melburn PopperNasher evaluated and appreciate additional Recc's  6. Acute Metabolic Enchephalopathy -Unclear whether it is caused from Narcotics vs. Pain vs. Delirium -? Hx of Dementia -Patient's Sodium was at 131 -Repeat CMP in AM -If worsens or persists will obtain Head CT Scan  7. Hx of AAA Repair -No Active Issues  DVT prophylaxis: SCDs Code Status: DO NOT RESUSCITATE Family Communication: Discussed Case with Daughter at bedside Disposition Plan: To have Surgery later this Afternoon.  Consultants:   Orthopedics Dr. Linna CapriceSwinteck  Cardiology Dr. Melburn PopperNasher  Procedures: ECHOCARDIOGRAM  Antimicrobials: IV Vancomycin for Surgical Prophylaxis  Subjective: Seen and examined this AM and is extremely hard of hearing. Answered questions but was altered and confused. Daughter at bedside and gave most of the history. Patient awaiting for ECHO and surgery later this afternoon. No N/V but patient's daughter states patient grimaces in pain occasionally. No other concerns or complaints at this time.   Objective: Vitals:   03/23/16 2000 03/23/16 2156 03/24/16 0005 03/24/16 0410  BP: (!) 185/69 (!) 143/57 (!) 150/91 (!) 134/94  Pulse: 84 73 78 75  Resp: 17  17 16   Temp: 98.6 F (37 C)  99.1 F (37.3 C) 99.9 F (37.7 C)  TempSrc: Oral  Oral Axillary  SpO2: 95% 94% 95% 94%    Intake/Output Summary (Last 24 hours) at 03/24/16 1321 Last data filed at 03/24/16 1300  Gross per 24 hour  Intake  0 ml  Output             1450 ml  Net            -1450 ml   There were no vitals filed for this visit.  Examination: Physical Exam:  Constitutional: Thin elderly female appears  Eyes: Lids and conjunctivae normal, sclerae anicteric  ENMT: External Ears, Nose appear normal. Grossly  normal hearing.  Neck: Appears normal, supple, no cervical masses, normal ROM, no appreciable thyromegaly Respiratory: Clear to auscultation bilaterally, no wheezing, rales, rhonchi or crackles. Normal respiratory effort and patient is not tachypenic. No accessory muscle use.  Cardiovascular: RRR, 3/6 Systolic Aortic Stenosis Murmur. S1 and S2 auscultated. No extremity edema.   Abdomen: Soft, non-tender, non-distended. No masses palpated. No appreciable hepatosplenomegaly. Bowel sounds positive x4.  GU: Deferred. Musculoskeletal: No clubbing / cyanosis of digits/nails. Right Hip Externally rotted. Skin: No rashes, lesions, ulcers on limited skin evaluation. No induration; Warm and dry.  Neurologic: Limited due to condition and difficulty hearing. Sensation appeared intact in all 4 Extremities. Strength 5/5 in all 4. Romberg sign cerebellar reflexes not assessed.  Psychiatric: Altered judgment and insight. Awake but not alert.   Data Reviewed: I have personally reviewed following labs and imaging studies  CBC:  Recent Labs Lab 03/23/16 0502  WBC 9.6  NEUTROABS 7.7  HGB 12.4  HCT 36.1  MCV 92.8  PLT 173   Basic Metabolic Panel:  Recent Labs Lab 03/23/16 0502  NA 131*  K 4.4  CL 98*  CO2 23  GLUCOSE 149*  BUN 16  CREATININE 1.04*  CALCIUM 9.4   GFR: CrCl cannot be calculated (Unknown ideal weight.). Liver Function Tests:  Recent Labs Lab 03/23/16 0502  AST 25  ALT 17  ALKPHOS 70  BILITOT 0.8  PROT 6.3*  ALBUMIN 3.7    Recent Labs Lab 03/23/16 0502  LIPASE 35   No results for input(s): AMMONIA in the last 168 hours. Coagulation Profile: No results for input(s): INR, PROTIME in the last 168 hours. Cardiac Enzymes:  Recent Labs Lab 03/23/16 0502 03/23/16 1224 03/23/16 1839  TROPONINI <0.03 <0.03 <0.03   BNP (last 3 results) No results for input(s): PROBNP in the last 8760 hours. HbA1C: No results for input(s): HGBA1C in the last 72 hours. CBG: No  results for input(s): GLUCAP in the last 168 hours. Lipid Profile: No results for input(s): CHOL, HDL, LDLCALC, TRIG, CHOLHDL, LDLDIRECT in the last 72 hours. Thyroid Function Tests: No results for input(s): TSH, T4TOTAL, FREET4, T3FREE, THYROIDAB in the last 72 hours. Anemia Panel: No results for input(s): VITAMINB12, FOLATE, FERRITIN, TIBC, IRON, RETICCTPCT in the last 72 hours. Sepsis Labs: No results for input(s): PROCALCITON, LATICACIDVEN in the last 168 hours.  Recent Results (from the past 240 hour(s))  Surgical pcr screen     Status: None   Collection Time: 03/24/16  5:34 AM  Result Value Ref Range Status   MRSA, PCR NEGATIVE NEGATIVE Final   Staphylococcus aureus NEGATIVE NEGATIVE Final    Comment:        The Xpert SA Assay (FDA approved for NASAL specimens in patients over 59 years of age), is one component of a comprehensive surveillance program.  Test performance has been validated by [facility] for patients greater than or equal to 74 year old. It is not intended to diagnose infection nor to guide or monitor treatment.     Radiology Studies: Dg Chest 2 View  Result Date: 03/23/2016 CLINICAL DATA:  Preop evaluation. EXAM: CHEST  2 VIEW COMPARISON:  Prior radiograph from 08/15/2015. FINDINGS: Patient is rotated. Allowing for rotation, cardiomegaly is stable. Mediastinal silhouette within normal limits. Aortic atherosclerosis noted. Lungs mildly hypoinflated. Mild pulmonary vascular congestion and interstitial prominence without overt pulmonary edema. No pleural effusion. No pneumothorax. No focal infiltrates identified. Diffuse osteopenia. No acute osseous abnormality. Aortic stent endograft noted. Scoliosis noted. IMPRESSION: 1. Stable cardiomegaly with mild diffuse pulmonary interstitial congestion without overt pulmonary edema. 2. Aortic atherosclerosis. Electronically Signed   By: Rise MuBenjamin  McClintock M.D.   On: 03/23/2016 05:43   Dg Knee 1-2 Views Right  Result  Date: 03/23/2016 CLINICAL DATA:  82 year old female with fall and right femoral neck fracture. EXAM: RIGHT KNEE - 1-2 VIEW COMPARISON:  None. FINDINGS: There is no acute fracture or dislocation. The bones are osteopenic. There is mild meniscal chondrocalcinosis. There is no joint effusion. The soft tissues appear unremarkable. Vascular calcification noted. IMPRESSION: No acute fracture or dislocation. Osteopenia. Electronically Signed   By: Elgie CollardArash  Radparvar M.D.   On: 03/23/2016 07:01   Dg Hip Unilat W Or Wo Pelvis 2-3 Views Right  Result Date: 03/23/2016 CLINICAL DATA:  82 year old female with fall and right hip deformity. EXAM: DG HIP (WITH OR WITHOUT PELVIS) 2-3V RIGHT COMPARISON:  CT of the abdomen pelvis dated 08/15/2015 FINDINGS: There is comminuted appearing intertrochanteric fracture of the right femur with mild valgus angulation. No other acute fracture identified. There is no dislocation. The bones are osteopenic. An aorta bi-iliac endovascular stent graft repair noted. There is moderate stool throughout the colon. The soft tissues are grossly unremarkable. IMPRESSION: Comminuted appearing intratrochanteric fracture of the right femur with mild valgus angulation. No dislocation. Electronically Signed   By: Elgie CollardArash  Radparvar M.D.   On: 03/23/2016 05:43   Scheduled Meds: . aspirin EC  81 mg Oral Daily  . carvedilol  3.125 mg Oral BID WC  . levothyroxine  50 mcg Oral QAC breakfast  . senna  1 tablet Oral QHS  . vancomycin  1,000 mg Intravenous To SSTC   Continuous Infusions: . sodium chloride 125 mL/hr at 03/23/16 1420     LOS: 1 day   Merlene Laughtermair Latif Sheikh, DO [facility] Pager [phone]  If 7PM-7AM, please contact night-coverage [email or web address] Password [email or web address] 03/24/2016, 1:21 PM

## 2016-03-24 NOTE — Progress Notes (Signed)
  Echocardiogram 2D Echocardiogram has been performed.  Soren Lazarz L Androw 03/24/2016, 1:16 PM

## 2016-03-24 NOTE — Discharge Instructions (Signed)
 Dr. Bransen Fassnacht [facility] Knee Specialist [facility] [address] [place], [place] [address] [phone]   POSTOPERATIVE DIRECTIONS    Hip Rehabilitation, Guidelines Following Surgery   WEIGHT BEARING Weight bearing as tolerated with assist device (walker, cane, etc) as directed, use it as long as suggested by your surgeon or therapist, typically at least 4-6 weeks.   HOME CARE INSTRUCTIONS  Remove items at home which could result in a fall. This includes throw rugs or furniture in walking pathways.  Continue medications as instructed at time of discharge.  You may have some home medications which will be placed on hold until you complete the course of blood thinner medication.  4 days after discharge, you may start showering. No tub baths or soaking your incisions. Do not put on socks or shoes without following the instructions of your caregivers.   Sit on chairs with arms. Use the chair arms to help push yourself up when arising.  Arrange for the use of a toilet seat elevator so you are not sitting low.   Walk with walker as instructed.  You may resume a sexual relationship in one month or when given the OK by your caregiver.  Use walker as long as suggested by your caregivers.  Avoid periods of inactivity such as sitting longer than an hour when not asleep. This helps prevent blood clots.  You may return to work once you are cleared by your surgeon.  Do not drive a car for 6 weeks or until released by your surgeon.  Do not drive while taking narcotics.  Wear elastic stockings for two weeks following surgery during the day but you may remove then at night.  Make sure you keep all of your appointments after your operation with all of your doctors and caregivers. You should call the office at the above phone number and make an appointment for approximately two weeks after the date of your surgery. Please pick up a stool softener and laxative  for home use as long as you are requiring pain medications.  ICE to the affected hip every three hours for 30 minutes at a time and then as needed for pain and swelling. Continue to use ice on the hip for pain and swelling from surgery. You may notice swelling that will progress down to the foot and ankle.  This is normal after surgery.  Elevate the leg when you are not up walking on it.   It is important for you to complete the blood thinner medication as prescribed by your doctor.  Continue to use the breathing machine which will help keep your temperature down.  It is common for your temperature to cycle up and down following surgery, especially at night when you are not up moving around and exerting yourself.  The breathing machine keeps your lungs expanded and your temperature down.  RANGE OF MOTION AND STRENGTHENING EXERCISES  These exercises are designed to help you keep full movement of your hip joint. Follow your caregiver's or physical therapist's instructions. Perform all exercises about fifteen times, three times per day or as directed. Exercise both hips, even if you have had only one joint replacement. These exercises can be done on a training (exercise) mat, on the floor, on a table or on a bed. Use whatever works the best and is most comfortable for you. Use music or television while you are exercising so that the exercises are a pleasant break in your day. This   will make your life better with the exercises acting as a break in routine you can look forward to.  Lying on your back, slowly slide your foot toward your buttocks, raising your knee up off the floor. Then slowly slide your foot back down until your leg is straight again.  Lying on your back spread your legs as far apart as you can without causing discomfort.  Lying on your side, raise your upper leg and foot straight up from the floor as far as is comfortable. Slowly lower the leg and repeat.  Lying on your back, tighten up the  muscle in the front of your thigh (quadriceps muscles). You can do this by keeping your leg straight and trying to raise your heel off the floor. This helps strengthen the largest muscle supporting your knee.  Lying on your back, tighten up the muscles of your buttocks both with the legs straight and with the knee bent at a comfortable angle while keeping your heel on the floor.   SKILLED REHAB INSTRUCTIONS: If the patient is transferred to a skilled rehab facility following release from the hospital, a list of the current medications will be sent to the facility for the patient to continue.  When discharged from the skilled rehab facility, please have the facility set up the patient's Home Health Physical Therapy prior to being released. Also, the skilled facility will be responsible for providing the patient with their medications at time of release from the facility to include their pain medication and their blood thinner medication. If the patient is still at the rehab facility at time of the two week follow up appointment, the skilled rehab facility will also need to assist the patient in arranging follow up appointment in our office and any transportation needs.  MAKE SURE YOU:  Understand these instructions.  Will watch your condition.  Will get help right away if you are not doing well or get worse.  Pick up stool softner and laxative for home use following surgery while on pain medications. Daily dry dressing changes as needed. In 4 days, you may remove your dressings and begin taking showers - no tub baths or soaking the incisions. Continue to use ice for pain and swelling after surgery. Do not use any lotions or creams on the incision until instructed by your surgeon.   

## 2016-03-24 NOTE — Progress Notes (Signed)
Patient Name: [name] [name] Date of Encounter: 03/24/2016  [facility]  Hospital Problem List     Active Problems:   Closed nondisplaced intertrochanteric fracture of right femur [facility](HCC)   Essential hypertension   Hypothyroidism, acquired   Palliative care encounter   Goals of care, counseling/discussion   Slow transit constipation     Subjective   82 y/o female with h/o AAA s/p repair in 2002, aortic stenosis and HTN admitted with right intertrochanteric femur fracture in the setting of an unwittnessed fall. Cardiology consulted for pre-operative risk assessment/ surgical clearance  Inpatient Medications    Scheduled Meds: . aspirin EC  81 mg Oral Daily  . carvedilol  3.125 mg Oral BID WC  . levothyroxine  50 mcg Oral QAC breakfast  . povidone-iodine  2 application Topical Once  . senna  1 tablet Oral QHS  . vancomycin  1,000 mg Intravenous To SSTC   Continuous Infusions: . sodium chloride 125 mL/hr at 03/23/16 1420   PRN Meds: HYDROcodone-acetaminophen, LORazepam, morphine injection   Vital Signs    Vitals:   03/23/16 2000 03/23/16 2156 03/24/16 0005 03/24/16 0410  BP: (!) 185/69 (!) 143/57 (!) 150/91 (!) 134/94  Pulse: 84 73 78 75  Resp: 17  17 16   Temp: 98.6 F (37 C)  99.1 F (37.3 C) 99.9 F (37.7 C)  TempSrc: Oral  Oral Axillary  SpO2: 95% 94% 95% 94%    Intake/Output Summary (Last 24 hours) at 03/24/16 1020 Last data filed at 03/24/16 0900  Gross per 24 hour  Intake                0 ml  Output             1000 ml  Net            -1000 ml   There were no vitals filed for this visit.  Physical Exam    GEN:  Elderly female,  Demented.  Uncomfortable ,   HEENT: Grossly normal.  Neck: Supple, no JVD, carotid bruits, or masses. Cardiac: RR, 2-3/6 systolic murmur radiating to URSB and to axilla.    Radial pulses are brisk - not suggestive of severe AS   Respiratory:  Respirations regular and unlabored, clear to auscultation  bilaterally. GI: Soft, nontender, nondistended, BS + x 4. MS: hs a right  hip fx  Skin: warm and dry, no rash. Neuro:   Difficult to assess  Psych: demented.   Labs    CBC  Recent Labs  03/23/16 0502  WBC 9.6  NEUTROABS 7.7  HGB 12.4  HCT 36.1  MCV 92.8  PLT 173   Basic Metabolic Panel  Recent Labs  03/23/16 0502  NA 131*  K 4.4  CL 98*  CO2 23  GLUCOSE 149*  BUN 16  CREATININE 1.04*  CALCIUM 9.4   Liver Function Tests  Recent Labs  03/23/16 0502  AST 25  ALT 17  ALKPHOS 70  BILITOT 0.8  PROT 6.3*  ALBUMIN 3.7    Recent Labs  03/23/16 0502  LIPASE 35   Cardiac Enzymes  Recent Labs  03/23/16 0502 03/23/16 1224 03/23/16 1839  TROPONINI <0.03 <0.03 <0.03   BNP Invalid input(s): POCBNP D-Dimer No results for input(s): DDIMER in the last 72 hours. Hemoglobin A1C No results for input(s): HGBA1C in the last 72 hours. Fasting Lipid Panel No results for input(s): CHOL, HDL, LDLCALC, TRIG, CHOLHDL, LDLDIRECT in the last 72 hours. Thyroid Function  Tests No results for input(s): TSH, T4TOTAL, T3FREE, THYROIDAB in the last 72 hours.  Invalid input(s): FREET3  Telemetry     ECG       Radiology    Dg Chest 2 View  Result Date: 03/23/2016 CLINICAL DATA:  Preop evaluation. EXAM: CHEST  2 VIEW COMPARISON:  Prior radiograph from 08/15/2015. FINDINGS: Patient is rotated. Allowing for rotation, cardiomegaly is stable. Mediastinal silhouette within normal limits. Aortic atherosclerosis noted. Lungs mildly hypoinflated. Mild pulmonary vascular congestion and interstitial prominence without overt pulmonary edema. No pleural effusion. No pneumothorax. No focal infiltrates identified. Diffuse osteopenia. No acute osseous abnormality. Aortic stent endograft noted. Scoliosis noted. IMPRESSION: 1. Stable cardiomegaly with mild diffuse pulmonary interstitial congestion without overt pulmonary edema. 2. Aortic atherosclerosis. Electronically Signed   By:  Rise MuBenjamin  McClintock M.D.   On: 03/23/2016 05:43   Dg Knee 1-2 Views Right  Result Date: 03/23/2016 CLINICAL DATA:  82 year old female with fall and right femoral neck fracture. EXAM: RIGHT KNEE - 1-2 VIEW COMPARISON:  None. FINDINGS: There is no acute fracture or dislocation. The bones are osteopenic. There is mild meniscal chondrocalcinosis. There is no joint effusion. The soft tissues appear unremarkable. Vascular calcification noted. IMPRESSION: No acute fracture or dislocation. Osteopenia. Electronically Signed   By: Elgie CollardArash  Radparvar M.D.   On: 03/23/2016 07:01   Dg Hip Unilat W Or Wo Pelvis 2-3 Views Right  Result Date: 03/23/2016 CLINICAL DATA:  82 year old female with fall and right hip deformity. EXAM: DG HIP (WITH OR WITHOUT PELVIS) 2-3V RIGHT COMPARISON:  CT of the abdomen pelvis dated 08/15/2015 FINDINGS: There is comminuted appearing intertrochanteric fracture of the right femur with mild valgus angulation. No other acute fracture identified. There is no dislocation. The bones are osteopenic. An aorta bi-iliac endovascular stent graft repair noted. There is moderate stool throughout the colon. The soft tissues are grossly unremarkable. IMPRESSION: Comminuted appearing intratrochanteric fracture of the right femur with mild valgus angulation. No dislocation. Electronically Signed   By: Elgie CollardArash  Radparvar M.D.   On: 03/23/2016 05:43    Cardiac Studies     Patient Profile   82 y/o female with h/o AAA s/p repair in 2002, aortic stenosis and HTN admitted with right intertrochanteric femur fracture in the setting of an unwittnessed fall. Cardiology consulted for pre-operative risk assessment/ surgical clearance   Assessment & Plan      1.  Aortic stenosis:    Echo is still not performed yet.  Clinically , the AS is not severe - she has no symptoms of CHF.   Peripheral pulses are brisk She is at moderate risk for her hip repair due to advance age.   The AS should not be an issue.  We  will be available to follow her after her surgery if needed     Signed, Kristeen MissPhilip Massie Cogliano, MD  03/24/2016, 10:20 AM

## 2016-03-24 NOTE — Brief Op Note (Signed)
03/24/2016  5:24 PM  PATIENT:  [name] [name]  82 y.o. female  PRE-OPERATIVE DIAGNOSIS:  R IT femur fracture  POST-OPERATIVE DIAGNOSIS:  R IT femur fracture  PROCEDURE:  Procedure(s): INTRAMEDULLARY (IM) NAIL FEMORAL (Right)  SURGEON:  Surgeon(s) and Role:    * Samson FredericBrian Esgar Barnick, MD - Primary  PHYSICIAN ASSISTANT:   ASSISTANTS: Patrick Jupiterarla Bethune, RNFA   ANESTHESIA:   general  EBL:  Total I/O In: 0  Out: 625 [Urine:525; Blood:100]  BLOOD ADMINISTERED:none  DRAINS: none   LOCAL MEDICATIONS USED:  NONE  SPECIMEN:  No Specimen  DISPOSITION OF SPECIMEN:  N/A  COUNTS:  YES  TOURNIQUET:  * No tourniquets in log *  DICTATION: .Other Dictation: Dictation Number [id number]  PLAN OF CARE: Admit to inpatient   PATIENT DISPOSITION:  PACU - hemodynamically stable.   Delay start of Pharmacological VTE agent (>24hrs) due to surgical blood loss or risk of bleeding: no

## 2016-03-24 NOTE — Anesthesia Preprocedure Evaluation (Addendum)
Anesthesia Evaluation  Patient identified by MRN, date of birth, ID band Patient awake    Reviewed: Allergy & Precautions, NPO status , Patient's Chart, lab work & pertinent test results  Airway Mallampati: II  TM Distance: >3 FB Neck ROM: Full    Dental no notable dental hx. (+) Edentulous Upper, Missing, Partial Lower   Pulmonary neg pulmonary ROS, former smoker,    Pulmonary exam normal breath sounds clear to auscultation       Cardiovascular hypertension, Pt. on home beta blockers and Pt. 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 negative neurological ROS     GI/Hepatic negative GI ROS, Neg liver ROS,   Endo/Other  negative endocrine ROSHypothyroidism On rx  Renal/GU negative Renal ROS     Musculoskeletal negative musculoskeletal ROS (+)   Abdominal   Peds  Hematology negative hematology ROS (+)   Anesthesia Other Findings   Reproductive/Obstetrics negative OB ROS                            Anesthesia Physical Anesthesia Plan  ASA: III  Anesthesia Plan: General   Post-op Pain Management:    Induction: Intravenous  Airway Management Planned: Oral ETT  Additional Equipment:   Intra-op Plan:   Post-operative Plan: Extubation in OR  Informed Consent: I have reviewed the patients History and Physical, chart, labs and discussed the procedure including the risks, benefits and alternatives for the proposed anesthesia with the patient or authorized representative who has indicated his/her understanding and acceptance.   Dental advisory given  Plan Discussed with: CRNA  Anesthesia Plan Comments:         Anesthesia Quick Evaluation

## 2016-03-24 NOTE — Op Note (Signed)
NAMRobynn Pane:  [place], [place]               ACCOUNT NO.:  [id number]  MEDICAL RECORD NO.:  [id number]  LOCATION:  A10C                         FACILITY:  [facility]  PHYSICIAN:  Samson FredericBrian Ismaeel Arvelo, MD     DATE OF BIRTH:  1917/01/26  DATE OF PROCEDURE:  03/24/2016 DATE OF DISCHARGE:                              OPERATIVE REPORT   SURGEON:  Samson FredericBrian Tommie Dejoseph, MD  ASSISTANT:  Patrick Jupiterarla Bethune, RNFA.  PREOPERATIVE DIAGNOSIS:  Right intertrochanteric femur fracture.  POSTOPERATIVE DIAGNOSIS:  Right intertrochanteric femur fracture.  PROCEDURE PERFORMED:  Intramedullary fixation of right intertrochanteric femur fracture.  IMPLANTS: 1. Biomet Affixus hip fracture nail 11 x 180 mm, 130 degrees. 2. Hip fracture lag screw 10.5 x 90 mm. 3. Cortical bone screw 5.0 x 32 mm.  ANESTHESIA:  General.  ESTIMATED BLOOD LOSS:  100 mL.  COMPLICATIONS:  None.  TUBES AND DRAINS:  None.  SPECIMENS:  None.  DISPOSITION:  Stable to PACU.  INDICATIONS:  The patient is a 82 year old female, who had a ground level fall at home.  She injured her right hip.  She had hip pain and inability to weight bear.  She was brought to the emergency department, where x-rays revealed a displaced intertrochanteric right femur fracture.  She was admitted to the Hospitalist Service.  She had a Cardiology consult for perioperative risk stratification and medical optimization.  The risks, benefits, and alternatives of surgical fixation were explained to the patient and family.  They elected to proceed.  DESCRIPTION OF PROCEDURE IN DETAIL:  I identified the patient in the holding area using 2 identifiers.  The surgical site was marked by myself.  She was taken to the operating room.  General anesthesia was induced on her bed.  [facility]  Fracture was reduced with traction, internal rotation, adduction; and the right lower extremity was prepped and draped in normal sterile surgical fashion.  Time-out was  called verifying side and site of surgery.  She did receive 2 g of Ancef within 60 minutes of beginning the procedure. I began by using fluoroscopy to define her anatomy.  I made a 4 cm incision proximal to the tip of the trochanter.  I used the awl to determine the standard starting point for a trochanteric entry nail.  A guide pin was placed under live fluoroscopic control.  I used the entry reamer.  Then, the real nail was opened and assembled on the jig and the nail was impacted into place to the appropriate depth using fluoroscopy. I made a separate stab incision.  I inserted the cannula down to bone for the cephalomedullary device.  A guide pin was placed center-center under live AP and lateral fluoroscopic control.  I then measured, reamed, and placed the real lag screw.  Compression knot was used to compress the fracture.  The set screw was then tightened and loosened one-quarter turn.  I made a separate stab incision.  I then used the cannula to place a single distal interlocking screw.  The jig was removed.  Final AP and lateral fluoroscopy views were used to confirm near anatomic fracture reduction and hardware placement.  Tip-apex distance was appropriate.  The  wounds were copiously irrigated with saline.  The wounds were closed in layers with #1 Vicryl for the fascia, 2-0 Monocryl for the deep dermal layer, running 3-0 Monocryl subcuticular stitch.  Glue was applied to the skin.  Once the glue was dried, Aquacel Ag dressing was applied.  The patient was then awakened from anesthesia, taken to the PACU in stable condition.  Sponge, needle, and instrument counts were correct for the case x2.  There were no known complications.  Postoperatively, we will readmit the patient to the hospitalist.  She may weight bear as tolerated with a walker.  She will need skilled nursing facility placement.  We will put her on Lovenox for DVT prophylaxis.  We will have her work with physical  therapy.  I will see her in the office 2 weeks after discharge.          ______________________________ Samson FredericBrian Taggart Prasad, MD     BS/MEDQ  D:  03/24/2016  T:  03/24/2016  Job:  [id number]

## 2016-03-24 NOTE — Transfer of Care (Signed)
Immediate Anesthesia Transfer of Care Note  Patient: [name] [name]  Procedure(s) Performed: Procedure(s): INTRAMEDULLARY (IM) NAIL FEMORAL (Right)  Patient Location: PACU  Anesthesia Type:General  Level of Consciousness: awake, alert  and patient cooperative  Airway & Oxygen Therapy: Patient Spontanous Breathing and Patient connected to face mask oxygen  Post-op Assessment: Report given to RN, Post -op Vital signs reviewed and stable and Patient moving all extremities  Post vital signs: Reviewed and stable  Last Vitals:  Vitals:   03/24/16 1300 03/24/16 1753  BP: 136/90 (!) (P) 148/73  Pulse: 78 88  Resp: 18 19  Temp: 37.4 C 36.7 C    Last Pain:  Vitals:   03/24/16 1300  TempSrc: Axillary         Complications: No apparent anesthesia complications

## 2016-03-24 NOTE — H&P (View-Only) (Signed)
ORTHOPAEDIC CONSULTATION  REQUESTING PHYSICIAN: Marinda Elk, MD  PCP:  [name], MD  Chief Complaint: right intertrochanteric femur fracture  HPI: [name] [name] is a 82 y.o. female who resides in an assisted living facility. Patient is extremely hard of hearing. The history was obtained from her daughter who is a retired Engineer, civil (consulting). Per the patient's daughter, the patient was found down in her bathroom yesterday. She is a in the room ambulator, basically ambulates from her wheelchair to the bed or to the commode. She propels around her facility using her feet in a wheelchair.  Past Medical History:  Diagnosis Date  . AAA (abdominal aortic aneurysm) (HCC)   . Alcoholism (HCC)   . Anxiety   . Hypertension   . Hypothyroidism   . Hypothyroidism   . Neuralgia   . OBS (organic brain syndrome)   . Trigeminal neuralgia   . UTI (lower urinary tract infection)    Past Surgical History:  Procedure Laterality Date  . ABDOMINAL ANGIOGRAM    . ABDOMINAL AORTIC ANEURYSM REPAIR  12/14/2000  . ABDOMINAL HYSTERECTOMY    . BRAIN SURGERY     janetta procedure   Social History   Social History  . Marital status: Widowed    Spouse name: N/A  . Number of children: N/A  . Years of education: N/A   Social History Main Topics  . Smoking status: Former Smoker    Types: Cigarettes    Quit date: 02/19/1962  . Smokeless tobacco: Never Used  . Alcohol use 4.2 oz/week    7 Glasses of wine per week  . Drug use: No  . Sexual activity: Not Asked   Other Topics Concern  . None   Social History Narrative  . None   Family History  Problem Relation Age of Onset  . Other Brother     coronary artery disaease   Allergies  Allergen Reactions  . Dilantin [Phenytoin Sodium Extended] Other (See Comments)    On MAR  . Penicillins Other (See Comments)    On MAR  . Tegretol [Carbamazepine] Other (See Comments)    On MAR   Prior to Admission medications   Medication Sig Start Date  End Date Taking? Authorizing Provider  acetaminophen (TYLENOL) 325 MG tablet Take 650 mg by mouth every 4 (four) hours as needed for headache.   Yes Historical Provider, MD  aspirin 81 MG tablet Take 81 mg by mouth daily.   Yes Historical Provider, MD  carvedilol (COREG) 3.125 MG tablet Take 3.125 mg by mouth 2 (two) times daily with a meal.   Yes Historical Provider, MD  docusate sodium (COLACE) 100 MG capsule Take 100 mg by mouth at bedtime.   Yes Historical Provider, MD  hydrochlorothiazide (HYDRODIURIL) 12.5 MG tablet Take 12.5 mg by mouth 3 (three) times a week. Monday, Wednesday and Fridays.   Yes Historical Provider, MD  levothyroxine (SYNTHROID, LEVOTHROID) 50 MCG tablet Take 50 mcg by mouth daily before breakfast.   Yes Historical Provider, MD  lisinopril (PRINIVIL,ZESTRIL) 40 MG tablet Take 20 mg by mouth daily.    Yes Historical Provider, MD  magnesium hydroxide (MILK OF MAGNESIA) 400 MG/5ML suspension Take 30 mLs by mouth daily as needed for mild constipation.   Yes Historical Provider, MD  Melatonin 3 MG TABS Take 3 mg by mouth at bedtime.   Yes Historical Provider, MD  psyllium (REGULOID) 0.52 g capsule Take 0.52 g by mouth daily.   Yes Historical Provider, MD  vitamin  B-12 (CYANOCOBALAMIN) 1000 MCG tablet Take 1,000 mcg by mouth daily.   Yes Historical Provider, MD  Vitamin D, Ergocalciferol, (DRISDOL) 50000 units CAPS capsule Take 50,000 Units by mouth every 7 (seven) days.   Yes Historical Provider, MD   Dg Chest 2 View  Result Date: 03/23/2016 CLINICAL DATA:  Preop evaluation. EXAM: CHEST  2 VIEW COMPARISON:  Prior radiograph from 08/15/2015. FINDINGS: Patient is rotated. Allowing for rotation, cardiomegaly is stable. Mediastinal silhouette within normal limits. Aortic atherosclerosis noted. Lungs mildly hypoinflated. Mild pulmonary vascular congestion and interstitial prominence without overt pulmonary edema. No pleural effusion. No pneumothorax. No focal infiltrates identified.  Diffuse osteopenia. No acute osseous abnormality. Aortic stent endograft noted. Scoliosis noted. IMPRESSION: 1. Stable cardiomegaly with mild diffuse pulmonary interstitial congestion without overt pulmonary edema. 2. Aortic atherosclerosis. Electronically Signed   By: Rise MuBenjamin  McClintock M.D.   On: 03/23/2016 05:43   Dg Knee 1-2 Views Right  Result Date: 03/23/2016 CLINICAL DATA:  82 year old female with fall and right femoral neck fracture. EXAM: RIGHT KNEE - 1-2 VIEW COMPARISON:  None. FINDINGS: There is no acute fracture or dislocation. The bones are osteopenic. There is mild meniscal chondrocalcinosis. There is no joint effusion. The soft tissues appear unremarkable. Vascular calcification noted. IMPRESSION: No acute fracture or dislocation. Osteopenia. Electronically Signed   By: Elgie CollardArash  Radparvar M.D.   On: 03/23/2016 07:01   Dg Hip Unilat W Or Wo Pelvis 2-3 Views Right  Result Date: 03/23/2016 CLINICAL DATA:  [age] year old female with fall and right hip deformity. EXAM: DG HIP (WITH OR WITHOUT PELVIS) 2-3V RIGHT COMPARISON:  CT of the abdomen pelvis dated 08/15/2015 FINDINGS: There is comminuted appearing intertrochanteric fracture of the right femur with mild valgus angulation. No other acute fracture identified. There is no dislocation. The bones are osteopenic. An aorta bi-iliac endovascular stent graft repair noted. There is moderate stool throughout the colon. The soft tissues are grossly unremarkable. IMPRESSION: Comminuted appearing intratrochanteric fracture of the right femur with mild valgus angulation. No dislocation. Electronically Signed   By: Elgie CollardArash  Radparvar M.D.   On: 03/23/2016 05:43    Positive ROS: All other systems have been reviewed and were otherwise negative with the exception of those mentioned in the HPI and as above.  Physical Exam: General: Alert, no acute distress Cardiovascular: No pedal edema Respiratory: No cyanosis, no use of accessory musculature GI: No  organomegaly, abdomen is soft and non-tender Skin: No lesions in the area of chief complaint Neurologic: Sensation intact distally Psychiatric: Patient is competent for consent with normal mood and affect Lymphatic: No axillary or cervical lymphadenopathy  MUSCULOSKELETAL: examination of the right lower extremity reveals no skin wounds or lesions over the hip. The extremity is shortened and externally rotated. She has pain with attempted logrolling of the hip. She is able to wiggle her toes. She has a  1+ pedal pulse. Sensation is intact to light touch  Assessment: Multiple medical problems Displaced right intertrochanteric femur fracture  Plan: I discussed the findings with the patient and her daughter. Unfortunately this is a difficult situation. Per the daughter's history, she has had some cardiac issues in the past. She indicates that the hospitalist recommends 2-D echo as well as cardiology consult for preoperative stratification and medical optimization. We did discuss the risks, benefits, and alternatives to surgical fixation of her right hip. Please see statement of risk. The patient and her daughter want to proceed with surgical stabilization of the hip fracture prevent her from becoming bedridden. They understand that  she has in excess of 30% risk of mortality within the next year. We will tentatively plan for surgery tomorrow pending cardiology evaluation. Hold chemical DVT prophylaxis. Nothing by mouth after midnight.   The risks, benefits, and alternatives were discussed with the patient. There are risks associated with the surgery including, but not limited to, problems with anesthesia (death), infection, differences in leg length/angulation/rotation, fracture of bones, loosening or failure of implants, malunion, nonunion, hematoma (blood accumulation) which may require surgical drainage, blood clots, pulmonary embolism, nerve injury (foot drop), and blood vessel injury. The patient  understands these risks and elects to proceed.   Jonothan Heberle, Cloyde ReamsBrian James, MD Cell [phone]    03/23/2016 7:49 AM

## 2016-03-24 NOTE — Anesthesia Procedure Notes (Signed)
Procedure Name: Intubation Date/Time: 03/24/2016 4:09 PM Performed by: Darcey NoraJAMES, Adylynn Hertenstein B Pre-anesthesia Checklist: Patient identified, Emergency Drugs available, Suction available and Patient being monitored Patient Re-evaluated:Patient Re-evaluated prior to inductionOxygen Delivery Method: Circle system utilized Preoxygenation: Pre-oxygenation with 100% oxygen Intubation Type: IV induction Ventilation: Mask ventilation without difficulty Laryngoscope Size: Mac and 3 Grade View: Grade I Tube type: Oral Tube size: 7.5 mm Number of attempts: 1 Airway Equipment and Method: Stylet Placement Confirmation: ETT inserted through vocal cords under direct vision,  positive ETCO2 and breath sounds checked- equal and bilateral Secured at: 21 (cm at upper gum) cm Tube secured with: Tape Dental Injury: Teeth and Oropharynx as per pre-operative assessment

## 2016-03-24 NOTE — Interval H&P Note (Signed)
History and Physical Interval Note:  03/24/2016 3:17 PM  [name] [name]  has presented today for surgery, with the diagnosis of femur fracture  The various methods of treatment have been discussed with the patient and family. After consideration of risks, benefits and other options for treatment, the patient has consented to  Procedure(s): INTRAMEDULLARY (IM) NAIL FEMORAL (Right) as a surgical intervention .  The patient's history has been reviewed, patient examined, no change in status, stable for surgery.  I have reviewed the patient's chart and labs.  Questions were answered to the patient's satisfaction.     Karla Vines, Cloyde ReamsBrian James

## 2016-03-25 DIAGNOSIS — D62 Acute posthemorrhagic anemia: Secondary | ICD-10-CM

## 2016-03-25 LAB — CBC WITH DIFFERENTIAL/PLATELET
Basophils Absolute: 0 10*3/uL (ref 0.0–0.1)
Basophils Relative: 0 %
EOS PCT: 1 %
Eosinophils Absolute: 0.1 10*3/uL (ref 0.0–0.7)
HEMATOCRIT: 24.6 % — AB (ref 36.0–46.0)
HEMOGLOBIN: 8.3 g/dL — AB (ref 12.0–15.0)
LYMPHS ABS: 0.7 10*3/uL (ref 0.7–4.0)
LYMPHS PCT: 11 %
MCH: 32 pg (ref 26.0–34.0)
MCHC: 33.7 g/dL (ref 30.0–36.0)
MCV: 95 fL (ref 78.0–100.0)
Monocytes Absolute: 0.6 10*3/uL (ref 0.1–1.0)
Monocytes Relative: 11 %
Neutro Abs: 4.7 10*3/uL (ref 1.7–7.7)
Neutrophils Relative %: 77 %
Platelets: 110 10*3/uL — ABNORMAL LOW (ref 150–400)
RBC: 2.59 MIL/uL — AB (ref 3.87–5.11)
RDW: 13.8 % (ref 11.5–15.5)
WBC: 6.1 10*3/uL (ref 4.0–10.5)

## 2016-03-25 LAB — COMPREHENSIVE METABOLIC PANEL
ALK PHOS: 43 U/L (ref 38–126)
ALT: 13 U/L — AB (ref 14–54)
AST: 20 U/L (ref 15–41)
Albumin: 2.3 g/dL — ABNORMAL LOW (ref 3.5–5.0)
Anion gap: 7 (ref 5–15)
BILIRUBIN TOTAL: 0.7 mg/dL (ref 0.3–1.2)
BUN: 12 mg/dL (ref 6–20)
CALCIUM: 7.6 mg/dL — AB (ref 8.9–10.3)
CO2: 23 mmol/L (ref 22–32)
CREATININE: 0.76 mg/dL (ref 0.44–1.00)
Chloride: 104 mmol/L (ref 101–111)
Glucose, Bld: 112 mg/dL — ABNORMAL HIGH (ref 65–99)
Potassium: 3.9 mmol/L (ref 3.5–5.1)
Sodium: 134 mmol/L — ABNORMAL LOW (ref 135–145)
Total Protein: 4.6 g/dL — ABNORMAL LOW (ref 6.5–8.1)

## 2016-03-25 LAB — MAGNESIUM: Magnesium: 1.7 mg/dL (ref 1.7–2.4)

## 2016-03-25 LAB — RETICULOCYTES
RBC.: 2.67 MIL/uL — AB (ref 3.87–5.11)
RETIC COUNT ABSOLUTE: 56.1 10*3/uL (ref 19.0–186.0)
RETIC CT PCT: 2.1 % (ref 0.4–3.1)

## 2016-03-25 LAB — PHOSPHORUS: PHOSPHORUS: 2.8 mg/dL (ref 2.5–4.6)

## 2016-03-25 LAB — FERRITIN: Ferritin: 182 ng/mL (ref 11–307)

## 2016-03-25 LAB — FOLATE: FOLATE: 9.5 ng/mL (ref >5.9)

## 2016-03-25 LAB — VITAMIN B12: VITAMIN B 12: 1286 pg/mL — AB (ref 180–914)

## 2016-03-25 LAB — IRON AND TIBC
Iron: 18 ug/dL — ABNORMAL LOW (ref 28–170)
Saturation Ratios: 10 % — ABNORMAL LOW (ref 10.4–31.8)
TIBC: 183 ug/dL — ABNORMAL LOW (ref 250–450)
UIBC: 165 ug/dL

## 2016-03-25 NOTE — Progress Notes (Signed)
Pt. Daughter asked this nurse several times to give the patient "something so she can rest". Pt. Resting quietly in bed, telesitter on in the room. Nurse informed daughter that the patient could have more pain medicine at 8pm.  Daughter then stated "Well, does she have an order for ativan?".  This nurse replied "Yes, but ativan is used for agitation, your mom is resting quietly". Daughter states "well, she was just agitated before you came in here".  Nurse informed daughter that the telesitter is a live person monitoring the room from another location. If the sitter see her mother pulling at IVs, attempting to get out of bed or anything else unsafe, the sitter will call the nurse.  We can not just give the patient ativan to make her sleepy, that is considered a chemical restraint. Also, we can not put the 4th bedrail up because that is considered a restraint as well. The daughter replied "times sure have changed since I was a nurse". Patient continues to rest in bed quietly. Nursing will monitor.

## 2016-03-25 NOTE — Clinical Social Work Note (Signed)
Clinical Social Work Assessment  Patient Details  Name: [name] [name] MRN: [id number] Date of Birth: 1916/11/23  Date of referral:  03/25/16               Reason for consult:  Discharge Planning, Facility Placement                Permission sought to share information with:  Family Supports, Magazine features editoracility Contact Representative, Case Estate manager/land agentManager Permission granted to share information::  Yes, Verbal Permission Granted  Name::      [name] Hong(Judy Kurka-Rogers )  Agency::   (SNF's )  Relationship::   (Daughter )  Contact Information:   [phone])  Housing/Transportation Living arrangements for the past 2 months:  Assisted Living Facility Source of Information:  Adult Children Patient Interpreter Needed:  None Criminal Activity/Legal Involvement Pertinent to Current Situation/Hospitalization:  No - Comment as needed Significant Relationships:  Adult Children Lives with:  Facility Resident Do you feel safe going back to the place where you live?  No Need for family participation in patient care:  Yes (Comment)  Care giving concerns:  [facility]. PT now recommending SNF placement.    Social Worker assessment / plan:  MSW spoke with patient's dtr, [name] HongJudy in reference to post-acute placement for SNF. MSW introduced MSW role. Pt's dtr reported that patient is a resident of [facility]. Pt's dtr agreeable to SNF placement and [facility]. [facility]. No further concerns reported at this time. MSW remains available as needed.   Employment status:  Retired Health and safety inspectornsurance information:  Medicare PT Recommendations:  Skilled Nursing Facility Information / Referral to community resources:  Skilled Nursing Facility  Patient/Family's Response to care:  Pt oriented to person only. Pt's dtr agreeable to SNF placement, supportive and involved in patient's care planning. Dtr appreciated social work intervention.    Patient/Family's Understanding of and Emotional Response to Diagnosis, Current Treatment, and Prognosis:  Pt's dtr knowledgeable of medical interventions.   Emotional Assessment Appearance:  Appears stated age Attitude/Demeanor/Rapport:  Unable to Assess Affect (typically observed):  Unable to Assess Orientation:  Oriented to Self Alcohol / Substance use:  Not Applicable Psych involvement (Current and /or in the community):  No (Comment)  Discharge Needs  Concerns to be addressed:  Denies Needs/Concerns at this time Readmission within the last 30 days:  No Current discharge risk:  Dependent with Mobility Barriers to Discharge:  Continued Medical Work up   Derenda FennelNixon, Eleyna Brugh A 03/25/2016, 3:16 PM

## 2016-03-25 NOTE — Clinical Social Work Note (Signed)
MS[facility] and left a message for a returned phone call.   [facility] does not have any female beds at this time.   MSW remains available as needed.   Derenda FennelBashira Irys Nigh, MSW [phone] 03/25/2016 3:44 PM

## 2016-03-25 NOTE — Clinical Social Work Note (Signed)
[facility] however patient's dtr, [name] has declined bed offer and stated that patient is not medically stable for discharge.   MSW notified MD of conversation. MD plans to discharge patient Sunday, 12/24 if stable.   MSW remains available as needed.   Derenda FennelBashira Ichael Pullara, MSW [phone] 03/25/2016 4:03 PM

## 2016-03-25 NOTE — Evaluation (Signed)
Physical Therapy Evaluation Patient Details Name: [name] [name] MRN: [id number] DOB: 1916/07/24 Today's Date: 03/25/2016   History of Present Illness  82 yo female admitted on 03/23/16 following fall out of her WC resulting in right hip fx. Pt underwent IM nail on 03/24/16. PMH significant for AAA, anxiety, trigeminal neuralgia.  Clinical Impression  Pt is POD 1 following above procedure and limited by pain and lethargy. Pt requires cues to stay on task with therapy assessment and requires Max A for all mobility to EOB and attempts to get from bed to chair. Unable to transfer pt from bed to chair due to significant posterior lean and resistance to movement. Pt yells out in pain with any movement of RLE. RN notified of pain and that daughter is requesting pain medications at this time. Prior to admission, pt was living in ALF and able to perform own mobility and transfers via wheelchair. Pt was found on the floor of the facility and had a right hip fx. Pt does, however, have increased pain with movement of LLE as well this session. Pt will benefit from SNF placement upon discharge prior to returning to ALF in order to improve strength and mobility. Pt will benefit from being seen acutely in order to assist with smooth transition to next venue of care.     Follow Up Recommendations SNF    Equipment Recommendations  None recommended by PT    Recommendations for Other Services       Precautions / Restrictions Precautions Precautions: Fall Precaution Comments: fell out of wheelchair Restrictions Weight Bearing Restrictions: Yes RLE Weight Bearing: Weight bearing as tolerated      Mobility  Bed Mobility Overal bed mobility: Needs Assistance Bed Mobility: Sit to Supine;Supine to Sit     Supine to sit: Max assist;+2 for physical assistance Sit to supine: Max assist;+2 for physical assistance   General bed mobility comments: Max A to EOB, pt leans posteriorly and is hollaring in pain.  Pt repositioned back in bed with MaxA  Transfers                    Ambulation/Gait                Stairs            Wheelchair Mobility    Modified Rankin (Stroke Patients Only)       Balance Overall balance assessment: Needs assistance;History of Falls Sitting-balance support: Bilateral upper extremity supported;Feet supported Sitting balance-Leahy Scale: Zero Sitting balance - Comments: leaning posteriorly and requires continuous assistance to maintain upright posture at EOB Postural control: Posterior lean                                   Pertinent Vitals/Pain Pain Assessment: Faces Faces Pain Scale: Hurts whole lot Pain Location: right hip with movement Pain Descriptors / Indicators: Grimacing;Guarding;Moaning;Other (Comment) (pt yells out in pain with any movement) Pain Intervention(s): Limited activity within patient's tolerance;Monitored during session;Repositioned;Patient requesting pain meds-RN notified;Ice applied    Home Living Family/patient expects to be discharged to:: Assisted living               Home Equipment: Wheelchair - manual;Grab bars - toilet;Grab bars - tub/shower;Tub bench;Bedside commode;Walker - 2 wheels      Prior Function Level of Independence: Needs assistance   Gait / Transfers Assistance Needed: maneuvered in WC, but was able to perfrom transfers  from surface to surface without assistance  ADL's / Homemaking Assistance Needed: needed assistance with bathing and meal prep which was performed at ALF  Comments: was fairly independent at ALF going to activities and transferring self per daughter     Hand Dominance   Dominant Hand: Right    Extremity/Trunk Assessment   Upper Extremity Assessment Upper Extremity Assessment: Generalized weakness    Lower Extremity Assessment Lower Extremity Assessment: Generalized weakness;Difficult to assess due to impaired cognition       Communication    Communication: Receptive difficulties  Cognition Arousal/Alertness: Lethargic;Suspect due to medications Behavior During Therapy: Agitated Overall Cognitive Status: Impaired/Different from baseline Area of Impairment: Following commands;Problem solving;Memory;Attention   Current Attention Level: Selective Memory: Decreased short-term memory Following Commands: Follows one step commands inconsistently;Follows one step commands with increased time     Problem Solving: Slow processing;Requires verbal cues;Requires tactile cues General Comments: per daughter, pt was is not normally disoriented nor hollaring out with movement. Pt appears confused and has difficulty following therapy commands and is not cooperative this session    General Comments General comments (skin integrity, edema, etc.): pt daughter is present throughout and provides history including previous level of function. Attempted stand pivot transfer, but pt pushing posteriorly and is uncooperative with instructions.     Exercises     Assessment/Plan    PT Assessment Patient needs continued PT services  PT Problem List Decreased strength;Decreased range of motion;Decreased activity tolerance;Decreased balance;Decreased mobility;Decreased knowledge of use of DME;Pain          PT Treatment Interventions DME instruction;Gait training;Functional mobility training;Therapeutic activities;Therapeutic exercise;Balance training    PT Goals (Current goals can be found in the Care Plan section)  Acute Rehab PT Goals Patient Stated Goal: none stated, daughter would like pt to return to ALF PT Goal Formulation: With patient/family Time For Goal Achievement: 04/01/16 Potential to Achieve Goals: Fair    Frequency Min 3X/week   Barriers to discharge        Co-evaluation               End of Session Equipment Utilized During Treatment: Gait belt Activity Tolerance: Patient limited by lethargy;Treatment limited  secondary to agitation Patient left: in bed;with call bell/phone within reach;with family/visitor present;with SCD's reapplied Nurse Communication: Patient requests pain meds         Time: [id number] PT Time Calculation (min) (ACUTE ONLY): 31 min   Charges:   PT Evaluation $PT Eval Moderate Complexity: 1 Procedure PT Treatments $Therapeutic Activity: 8-22 mins   PT G Codes:        Colin BroachSabra M. Jhonny Calixto PT, DPT  [phone]  03/25/2016, 2:45 PM

## 2016-03-25 NOTE — Progress Notes (Signed)
   Subjective: 1 Day Post-Op Procedure(s) (LRB): INTRAMEDULLARY (IM) NAIL FEMORAL (Right) Patient reports pain as mild.   Patient seen in rounds for Dr. Linna CapriceSwinteck Patient is well, and has had no acute complaints or problems. Patient sleeping comfortable in bed during rounds. Difficult to arouse. No issues overnight.   Objective: Vital signs in last 24 hours: Temp:  [97.8 F (36.6 C)-99.4 F (37.4 C)] 98.7 F (37.1 C) (12/23 0450) Pulse Rate:  [60-108] 60 (12/23 0450) Resp:  [12-28] 18 (12/23 0450) BP: (126-170)/(56-122) 144/58 (12/23 0529) SpO2:  [88 %-100 %] 99 % (12/23 0450)  Intake/Output from previous day:  Intake/Output Summary (Last 24 hours) at 03/25/16 0749 Last data filed at 03/25/16 0500  Gross per 24 hour  Intake             1500 ml  Output             1125 ml  Net              375 ml     Labs:  Recent Labs  03/23/16 0502 03/24/16 2057  HGB 12.4 9.0*    Recent Labs  03/23/16 0502 03/24/16 2057  WBC 9.6 8.8  RBC 3.89 2.86*  HCT 36.1 27.0*  PLT 173 127*    Recent Labs  03/23/16 0502 03/24/16 2057  NA 131*  --   K 4.4  --   CL 98*  --   CO2 23  --   BUN 16  --   CREATININE 1.04* 0.88  GLUCOSE 149*  --   CALCIUM 9.4  --      EXAM General - Patient is Appropriate. Diffiuclt to arouse but patient followed commands when asked to dorsiflex and plantar flex her foot. Returned to sleep immediately.  Extremity - Neurologically intact Dorsiflexion/Plantar flexion intact Compartment soft Dressing - dressing C/D/I Motor Function - intact, moving foot and toes well on exam.    Past Medical History:  Diagnosis Date  . AAA (abdominal aortic aneurysm) (HCC)   . Alcoholism (HCC)   . Anxiety   . Hypertension   . Hypothyroidism   . Hypothyroidism   . Neuralgia   . OBS (organic brain syndrome)   . Trigeminal neuralgia   . UTI (lower urinary tract infection)     Assessment/Plan: 1 Day Post-Op Procedure(s) (LRB): INTRAMEDULLARY (IM) NAIL  FEMORAL (Right) Active Problems:   Closed nondisplaced intertrochanteric fracture of right femur (HCC)   Essential hypertension   Hypothyroidism, acquired   Palliative care encounter   Goals of care, counseling/discussion   Slow transit constipation   History of abdominal aortic aneurysm (AAA)   Acute metabolic encephalopathy   Closed comminuted intertrochanteric fracture of proximal end of right femur (HCC)  Estimated body mass index is 21.58 kg/m as calculated from the following:   Height as of 02/20/12: 5\' 2"  (1.575 m).   Weight as of 02/20/12: 53.5 kg (118 lb). Advance diet Up with therapy  DVT Prophylaxis - Lovenox Weight Bearing As Tolerated   Will have the patient work with therapy today. Plan for placement to SNF when medically stable.   Dimitri PedAmber Aryani Daffern, PA-C Orthopaedic Surgery 03/25/2016, 7:49 AM

## 2016-03-25 NOTE — Clinical Social Work Placement (Signed)
   CLINICAL SOCIAL WORK PLACEMENT  NOTE  Date:  03/25/2016  Patient Details  Name: [name] MRN: [id number] Date of Birth: 1916/05/02  Clinical Social Work is seeking post-discharge placement for this patient at the Skilled  Nursing Facility level of care (*CSW will initial, date and re-position this form in  chart as items are completed):  Yes   Patient/family provided with [facility] Clinical Social Work Department's list of facilities offering this level of care within the geographic area requested by the patient (or if unable, by the patient's family).  Yes   Patient/family informed of their freedom to choose among providers that offer the needed level of care, that participate in Medicare, Medicaid or managed care program needed by the patient, have an available bed and are willing to accept the patient.  Yes   Patient/family informed of [facility]'s ownership interest in [facility] and [facility], as well as of the fact that they are under no obligation to receive care at these facilities.  PASRR submitted to EDS on 03/25/16     PASRR number received on 03/25/16     Existing PASRR number confirmed on       FL2 transmitted to all facilities in geographic area requested by pt/family on 03/25/16     FL2 transmitted to all facilities within larger geographic area on       Patient informed that his/her managed care company has contracts with or will negotiate with certain facilities, including the following:            Patient/family informed of bed offers received.  Patient chooses bed at       Physician recommends and patient chooses bed at      Patient to be transferred to   on  .  Patient to be transferred to facility by       Patient family notified on   of transfer.  Name of family member notified:        PHYSICIAN Please sign FL2, Please prepare priority discharge summary, including medications     Additional Comment:     _______________________________________________ Derenda FennelNixon, Zyair Russi A 03/25/2016, 3:27 PM

## 2016-03-25 NOTE — Progress Notes (Signed)
PROGRESS NOTE    [name] [name]  BJY:[id number] DOB: 14-Oct-1916 DOA: 03/23/2016  PCP: Florentina JennyRIPP, HENRY, MD   Brief Narrative:  [name] Evansrma C Konopka is 82 y.o. female who presents from ALF with a past medical history significant for HTN, dementia hypothyroidism and AAA s/p endorepair who presents with hip pain after a fall. Due to dementia, history difficult to obtain.   Subjective: No complaints- confused.   Assessment & Plan:   Principal Problem: R hip fracture  Intramedullary fixation on 12/22- management per ortho- needs SNF   Dementia with hospital acquired delirium - per daughter, patient has severe short term memory loss - PRn Ativan for severe agitation only - sitter at bedside- daughter is sitting with her as well  Anemia due to acute blood loss - drop in Hb from 13.8 to 11.9- follow - anemia panel consistent with AOCD   HTN: -Continue BB   -Hold ACEi -Hold HCTZ for now - BP stable   Hypothyroidism: -Continue levothyroxine  Hyponatremia: -Monitor fluid status - holding HCTZ  Disposition- palliative care consulted- per daughter, no aggressive measures- DNR- can give IVF and antibiotics as needed   DVT prophylaxis: Lovenox Code Status: DNR Family Communication: daughter at bedside Disposition Plan: SNF Consultants:   Ortho Procedures:  2 D ECHO Left ventricle: The cavity size was normal. There was moderate   concentric hypertrophy. Systolic function was vigorous. The   estimated ejection fraction was in the range of 65% to 70%. Wall   motion was normal; there were no regional wall motion   abnormalities. The study is not technically sufficient to allow   evaluation of LV diastolic function. - Aortic valve: Valve mobility was restricted. There was mild   stenosis. There was mild regurgitation. Valve area (VTI): 2.1   cm^2. Valve area (Vmax): 1.59 cm^2. Valve area (Vmean): 1.76   cm^2. - Mitral valve: Calcified annulus. Transvalvular velocity was  within the normal range. There was no evidence for stenosis.   There was mild regurgitation. - Left atrium: The atrium was moderately dilated. - Right ventricle: The cavity size was normal. Wall thickness was   normal. Systolic function was normal. - Tricuspid valve: There was mild regurgitation. - Pulmonary arteries: PA peak pressure: 38 mm Hg (S). - Pericardium, extracardiac: A trivial pericardial effusion was   identified.  Antimicrobials:  Anti-infectives    Start     Dose/Rate Route Frequency Ordered Stop   03/24/16 2230  clindamycin (CLEOCIN) IVPB 600 mg     600 mg 100 mL/hr over 30 Minutes Intravenous Every 6 hours 03/24/16 2036 03/25/16 0540   03/24/16 1200  vancomycin (VANCOCIN) IVPB 1000 mg/200 mL premix  Status:  Discontinued     1,000 mg 200 mL/hr over 60 Minutes Intravenous To Short Stay 03/24/16 0532 03/24/16 2030       Objective: Vitals:   03/24/16 2100 03/25/16 0040 03/25/16 0450 03/25/16 0529  BP:  135/90 (!) 143/122 (!) 144/58  Pulse:  98 60   Resp:  18 18   Temp:  98.5 F (36.9 C) 98.7 F (37.1 C)   TempSrc:  Axillary Axillary   SpO2: 99% 98% 99%     Intake/Output Summary (Last 24 hours) at 03/25/16 1320 Last data filed at 03/25/16 0500  Gross per 24 hour  Intake             1500 ml  Output              675 ml  Net  825 ml   There were no vitals filed for this visit.  Examination: General exam: Appears comfortable - minimally verbal- mildly agitated HEENT: PERRLA, oral mucosa moist, no sclera icterus or thrush Respiratory system: Clear to auscultation. Respiratory effort normal. Cardiovascular system: S1 & S2 heard, RRR.  No murmurs  Gastrointestinal system: Abdomen soft, non-tender, nondistended. Normal bowel sound. No organomegaly Central nervous system: Alert- confused  No focal neurological deficits. Extremities: No cyanosis, clubbing -  right hip edema Skin: No rashes or ulcers Psychiatry:  Confused and restless    Data  Reviewed: I have personally reviewed following labs and imaging studies  CBC:  Recent Labs Lab 03/23/16 0502 03/24/16 2057 03/25/16 0610  WBC 9.6 8.8 6.1  NEUTROABS 7.7  --  4.7  HGB 12.4 9.0* 8.3*  HCT 36.1 27.0* 24.6*  MCV 92.8 94.4 95.0  PLT 173 127* 110*   Basic Metabolic Panel:  Recent Labs Lab 03/23/16 0502 03/24/16 2057 03/25/16 0610  NA 131*  --  134*  K 4.4  --  3.9  CL 98*  --  104  CO2 23  --  23  GLUCOSE 149*  --  112*  BUN 16  --  12  CREATININE 1.04* 0.88 0.76  CALCIUM 9.4  --  7.6*  MG  --   --  1.7  PHOS  --   --  2.8   GFR: CrCl cannot be calculated (Unknown ideal weight.). Liver Function Tests:  Recent Labs Lab 03/23/16 0502 03/25/16 0610  AST 25 20  ALT 17 13*  ALKPHOS 70 43  BILITOT 0.8 0.7  PROT 6.3* 4.6*  ALBUMIN 3.7 2.3*    Recent Labs Lab 03/23/16 0502  LIPASE 35   No results for input(s): AMMONIA in the last 168 hours. Coagulation Profile: No results for input(s): INR, PROTIME in the last 168 hours. Cardiac Enzymes:  Recent Labs Lab 03/23/16 0502 03/23/16 1224 03/23/16 1839  TROPONINI <0.03 <0.03 <0.03   BNP (last 3 results) No results for input(s): PROBNP in the last 8760 hours. HbA1C: No results for input(s): HGBA1C in the last 72 hours. CBG: No results for input(s): GLUCAP in the last 168 hours. Lipid Profile: No results for input(s): CHOL, HDL, LDLCALC, TRIG, CHOLHDL, LDLDIRECT in the last 72 hours. Thyroid Function Tests: No results for input(s): TSH, T4TOTAL, FREET4, T3FREE, THYROIDAB in the last 72 hours. Anemia Panel:  Recent Labs  03/25/16 0936  VITAMINB12 1,286*  FOLATE 9.5  FERRITIN 182  TIBC 183*  IRON 18*  RETICCTPCT 2.1   Urine analysis:    Component Value Date/Time   COLORURINE YELLOW 08/15/2015 0730   APPEARANCEUR CLOUDY (A) 08/15/2015 0730   LABSPEC 1.010 08/15/2015 0730   PHURINE 7.0 08/15/2015 0730   GLUCOSEU NEGATIVE 08/15/2015 0730   HGBUR MODERATE (A) 08/15/2015 0730    BILIRUBINUR NEGATIVE 08/15/2015 0730   KETONESUR NEGATIVE 08/15/2015 0730   PROTEINUR NEGATIVE 08/15/2015 0730   UROBILINOGEN 0.2 01/21/2010 1600   NITRITE NEGATIVE 08/15/2015 0730   LEUKOCYTESUR TRACE (A) 08/15/2015 0730   Sepsis Labs: @LABRCNTIP (procalcitonin:4,lacticidven:4) ) Recent Results (from the past 240 hour(s))  Surgical pcr screen     Status: None   Collection Time: 03/24/16  5:34 AM  Result Value Ref Range Status   MRSA, PCR NEGATIVE NEGATIVE Final   Staphylococcus aureus NEGATIVE NEGATIVE Final    Comment:        The Xpert SA Assay (FDA approved for NASAL specimens in patients over 47 years of age), is one  component of a comprehensive surveillance program.  Test performance has been validated by [facility] for patients greater than or equal to 80 year old. It is not intended to diagnose infection nor to guide or monitor treatment.          Radiology Studies: Pelvis Portable  Result Date: 03/24/2016 CLINICAL DATA:  ORIF right femur fracture EXAM: PORTABLE PELVIS 1-2 VIEWS COMPARISON:  Intraoperative radiographs dated 03/24/2016 at 1711 hours FINDINGS: Status post IM nail with dynamic hip screw fixation of an intertrochanteric right hip fracture. Fracture fragments are in near anatomic alignment and position. Visualized bony pelvis appears intact. Bilateral hip joint spaces are symmetric. Vascular calcifications.  Iliac stents. IMPRESSION: Status post ORIF of an intertrochanteric right hip fracture, as above. Electronically Signed   By: Charline BillsSriyesh  Krishnan M.D.   On: 03/24/2016 18:39   Dg C-arm 1-60 Min  Result Date: 03/24/2016 CLINICAL DATA:  Intramedullary femoral nailing and rodding for fracture EXAM: DG C-ARM 61-120 MIN; OPERATIVE RIGHT HIP WITH PELVIS COMPARISON:  03/23/2016 FINDINGS: Three low resolution spot intraoperative images of the right hip are submitted. Total fluoroscopy time was 10 seconds. The images demonstrate intramedullary rodding of a right  intertrochanteric fracture. Anatomic alignment on the limited views submitted. IMPRESSION: Intraoperative fluoroscopic assistance provided during intramedullary rod fixation of right proximal femoral fracture Electronically Signed   By: Jasmine PangKim  Fujinaga M.D.   On: 03/24/2016 19:04   Dg Hip Operative Unilat W Or W/o Pelvis Right  Result Date: 03/24/2016 CLINICAL DATA:  Intramedullary femoral nailing and rodding for fracture EXAM: DG C-ARM 61-120 MIN; OPERATIVE RIGHT HIP WITH PELVIS COMPARISON:  03/23/2016 FINDINGS: Three low resolution spot intraoperative images of the right hip are submitted. Total fluoroscopy time was 10 seconds. The images demonstrate intramedullary rodding of a right intertrochanteric fracture. Anatomic alignment on the limited views submitted. IMPRESSION: Intraoperative fluoroscopic assistance provided during intramedullary rod fixation of right proximal femoral fracture Electronically Signed   By: Jasmine PangKim  Fujinaga M.D.   On: 03/24/2016 19:04      Scheduled Meds: . aspirin EC  81 mg Oral Daily  . carvedilol  3.125 mg Oral BID WC  . enoxaparin (LOVENOX) injection  30 mg Subcutaneous Q24H  . levothyroxine  50 mcg Oral QAC breakfast   Continuous Infusions: . sodium chloride 125 mL/hr at 03/24/16 2239     LOS: 2 days    Time spent in minutes: 35    Deshara Rossi, MD [facility] Pager: [email or web address] Password [email or web address] 03/25/2016, 1:20 PM

## 2016-03-25 NOTE — NC FL2 (Signed)
[place] MEDICAID FL2 LEVEL OF CARE SCREENING TOOL     IDENTIFICATION  Patient Name: Shea Evansrma C Mikel Birthdate: [birth year] Sex: female Admission Date (Current Location): 03/23/2016  [facility] [place]   Facility and Address:  [facility], [address], [place], [place] [address]      Provider Number: [id number]  Attending Physician Name and Address:  Calvert CantorSaima Rizwan, MD  Relative Name and Phone Number:       Current Level of Care: Hospital Recommended Level of Care: Skilled Nursing Facility Prior Approval Number:    Date Approved/Denied:   PASRR Number:   [id number]  Discharge Plan: SNF    Current Diagnoses: Patient Active Problem List   Diagnosis Date Noted  . Postoperative anemia due to acute blood loss 03/25/2016  . History of abdominal aortic aneurysm (AAA)   . Acute metabolic encephalopathy   . Closed nondisplaced intertrochanteric fracture of right femur (HCC) 03/23/2016  . Essential hypertension 03/23/2016  . Hypothyroidism, acquired 03/23/2016  . Palliative care encounter   . Goals of care, counseling/discussion   . Slow transit constipation   . AAA (abdominal aortic aneurysm) (HCC) 02/20/2012    Orientation RESPIRATION BLADDER Height & Weight     Self  O2 (at 2L) Continent Weight:   Height:     BEHAVIORAL SYMPTOMS/MOOD NEUROLOGICAL BOWEL NUTRITION STATUS   (none)  (none) Continent Diet (SOFT )  AMBULATORY STATUS COMMUNICATION OF NEEDS Skin   Extensive Assist Verbally Surgical wounds                       Personal Care Assistance Level of Assistance  Dressing, Feeding, Bathing Bathing Assistance: Maximum assistance Feeding assistance: Independent Dressing Assistance: Maximum assistance     Functional Limitations Info  Speech, Hearing, Sight Sight Info: Adequate Hearing Info: Adequate Speech Info: Adequate    SPECIAL CARE FACTORS FREQUENCY  PT (By licensed PT)     PT Frequency: 3               Contractures      Additional Factors Info  Code Status, Allergies Code Status Info: DNR CODE Allergies Info:  (Dilantin Phenytoin Sodium Extended, Penicillins, Tegretol Carbamazepine)           Current Medications (03/25/2016):  This is the current hospital active medication list Current Facility-Administered Medications  Medication Dose Route Frequency Provider Last Rate Last Dose  . 0.9 %  sodium chloride infusion   Intravenous Continuous Calvert CantorSaima Rizwan, MD 75 mL/hr at 03/25/16 1410    . acetaminophen (TYLENOL) tablet 650 mg  650 mg Oral Q6H PRN Samson FredericBrian Swinteck, MD       Or  . acetaminophen (TYLENOL) suppository 650 mg  650 mg Rectal Q6H PRN Samson FredericBrian Swinteck, MD      . aspirin EC tablet 81 mg  81 mg Oral Daily Alberteen Samhristopher P Danford, MD   81 mg at 03/25/16 0920  . carvedilol (COREG) tablet 3.125 mg  3.125 mg Oral BID WC Alberteen Samhristopher P Danford, MD   3.125 mg at 03/25/16 0919  . enoxaparin (LOVENOX) injection 30 mg  30 mg Subcutaneous Q24H Samson FredericBrian Swinteck, MD   30 mg at 03/25/16 0920  . HYDROcodone-acetaminophen (NORCO/VICODIN) 5-325 MG per tablet 1-2 tablet  1-2 tablet Oral Q6H PRN Alberteen Samhristopher P Danford, MD   2 tablet at 03/25/16 1410  . levothyroxine (SYNTHROID, LEVOTHROID) tablet 50 mcg  50 mcg Oral QAC breakfast Alberteen Samhristopher P Danford, MD   50  mcg at 03/24/16 0933  . LORazepam (ATIVAN) injection 0.5 mg  0.5 mg Intravenous Q6H PRN Stephani PoliceMarianne L York, PA-C   0.5 mg at 03/24/16 2043  . menthol-cetylpyridinium (CEPACOL) lozenge 3 mg  1 lozenge Oral PRN Samson FredericBrian Swinteck, MD       Or  . phenol (CHLORASEPTIC) mouth spray 1 spray  1 spray Mouth/Throat PRN Samson FredericBrian Swinteck, MD      . metoCLOPramide (REGLAN) tablet 5-10 mg  5-10 mg Oral Q8H PRN Samson FredericBrian Swinteck, MD       Or  . metoCLOPramide (REGLAN) injection 5-10 mg  5-10 mg Intravenous Q8H PRN Samson FredericBrian Swinteck, MD      . ondansetron [facility](ZOFRAN) tablet 4 mg  4 mg Oral Q6H PRN Samson FredericBrian Swinteck, MD       Or  . ondansetron (ZOFRAN) injection 4 mg  4 mg  Intravenous Q6H PRN Samson FredericBrian Swinteck, MD         Discharge Medications: Please see discharge summary for a list of discharge medications.  Relevant Imaging Results:  Relevant Lab Results:   Additional Information SSN [id number]  Vaughan Brownerixon, Oswin  A

## 2016-03-25 NOTE — Progress Notes (Signed)
OT Cancellation Note  Patient Details Name: [name] [name] MRN: [id number] DOB: Aug 30, 1916   Cancelled Treatment:    Reason Eval/Treat Not Completed: Other (comment): OT screened, pt with plan to D/C to SNF. All OT needs can be met at next venue of care. Acute OT will defer to SNF and sign off.   [address] OTR/L [phone] 03/25/2016, 7:47 AM

## 2016-03-26 DIAGNOSIS — S72141D Displaced intertrochanteric fracture of right femur, subsequent encounter for closed fracture with routine healing: Secondary | ICD-10-CM

## 2016-03-26 LAB — BASIC METABOLIC PANEL
Anion gap: 5 (ref 5–15)
BUN: 14 mg/dL (ref 6–20)
CHLORIDE: 109 mmol/L (ref 101–111)
CO2: 22 mmol/L (ref 22–32)
CREATININE: 0.76 mg/dL (ref 0.44–1.00)
Calcium: 7.9 mg/dL — ABNORMAL LOW (ref 8.9–10.3)
GFR calc Af Amer: >60 mL/min (ref >60)
GFR calc non Af Amer: >60 mL/min (ref >60)
Glucose, Bld: 94 mg/dL (ref 65–99)
Potassium: 3.9 mmol/L (ref 3.5–5.1)
Sodium: 136 mmol/L (ref 135–145)

## 2016-03-26 LAB — CBC
HEMATOCRIT: 24.6 % — AB (ref 36.0–46.0)
HEMOGLOBIN: 8.1 g/dL — AB (ref 12.0–15.0)
MCH: 31.6 pg (ref 26.0–34.0)
MCHC: 32.9 g/dL (ref 30.0–36.0)
MCV: 96.1 fL (ref 78.0–100.0)
Platelets: 120 10*3/uL — ABNORMAL LOW (ref 150–400)
RBC: 2.56 MIL/uL — ABNORMAL LOW (ref 3.87–5.11)
RDW: 13.7 % (ref 11.5–15.5)
WBC: 5.2 10*3/uL (ref 4.0–10.5)

## 2016-03-26 MED ORDER — HYDROCODONE-ACETAMINOPHEN 5-325 MG PO TABS: 1.0000 | ORAL_TABLET | Freq: Four times a day (QID) | ORAL | 0 refills | Status: DC | PRN

## 2016-03-26 MED ORDER — ENOXAPARIN SODIUM 30 MG/0.3ML ~~LOC~~ SOLN: 30.0000 mg | SUBCUTANEOUS | Status: AC

## 2016-03-26 MED ORDER — LORAZEPAM 1 MG PO TABS: 1.0000 mg | ORAL_TABLET | Freq: Three times a day (TID) | ORAL | 0 refills | Status: DC | PRN

## 2016-03-26 NOTE — Clinical Social Work Note (Addendum)
MSW spoke with Attending MD and Ortho MD in regards to patient's dtr, [name] refusing available bed offers for discharge.   Ortho MD to contact dtr to reiterate that pt is cleared from othro standpoint.   DC orders requested from Attending. RNCM notified.   Derenda FennelBashira Euretha Najarro, MSW [phone] 03/26/2016 12:16 PM

## 2016-03-26 NOTE — Clinical Social Work Placement (Signed)
Medical Social Worker facilitated patient discharge including contacting patient family and facility to confirm patient discharge plans.  Clinical information faxed to facility and family agreeable with plan.  MSW arranged ambulance transport via [facility].  RN to call report prior to discharge.  Medical Social Worker will sign off for now as social work intervention is no longer needed. Please consult us again if new need arises.   CLINICAL SOCIAL WORK PLACEMENT  NOTE  Date:  03/26/2016  Patient Details  Name: [name] [name] MRN: [id number] Date of Birth: [birth year]  Clinical Social Work is seeking post-discharge placement for this patient at the Skilled  Nursing Facility level of care (*CSW will initial, date and re-position this form in  chart as items are completed):  Yes   Patient/family provided with [facility] Clinical Social Work Department's list of facilities offering this level of care within the geographic area requested by the patient (or if unable, by the patient's family).  Yes   Patient/family informed of their freedom to choose among providers that offer the needed level of care, that participate in Medicare, Medicaid or managed care program needed by the patient, have an available bed and are willing to accept the patient.  Yes   Patient/family informed of [facility]'s ownership interest in [facility] and [facility], as well as of the fact that they are under no obligation to receive care at these facilities.  PASRR submitted to EDS on 03/25/16     PASRR number received on 03/25/16     Existing PASRR number confirmed on       FL2 transmitted to all facilities in geographic area requested by pt/family on 03/25/16     FL2 transmitted to all facilities within larger geographic area on       Patient informed that his/her managed care company has contracts with or will negotiate with certain facilities, including the following:         Yes   Patient/family informed of bed offers received.  Patient chooses bed at  [facility] )     Physician recommends and patient chooses bed at      Patient to be transferred to  [facility] on 03/26/16.  Patient to be transferred to facility by  [facility] Mons([facility] )     Patient family notified on 03/26/16 of transfer.  Name of family member notified:  Pt's dtr, [name] [name]      PHYSICIAN Please sign FL2, Please prepare priority discharge summary, including medications, Please sign DNR     Additional Comment:    _______________________________________________ Derenda FennelNixon, Kanita Delage A 03/26/2016, 12:55 PM

## 2016-03-26 NOTE — Clinical Social Work Note (Signed)
Patient's dtr, [name] refusing available bed offers for discharge.   MD notified.   Derenda FennelBashira Shlok Raz, MSW [phone] 03/26/2016 11:41 AM

## 2016-03-26 NOTE — Progress Notes (Signed)
Patient to be discharged to SNF. Nurse called twice to give report at [facility] with no answer. IV removed. [facility] here for transportation

## 2016-03-26 NOTE — Progress Notes (Addendum)
   Subjective: 2 Days Post-Op Procedure(s) (LRB): INTRAMEDULLARY (IM) NAIL FEMORAL (Right) Patient reports pain as mild.   Patient seen in rounds for Dr. Linna CapriceSwinteck Patient is well, and has had no acute complaints or problems. Patient sleeping comfortable in bed during rounds. No issues overnight.   Objective: Vital signs in last 24 hours: Temp:  [98.1 F (36.7 C)-99 F (37.2 C)] 98.1 F (36.7 C) (12/24 0613) Pulse Rate:  [68-88] 68 (12/24 0613) Resp:  [16] 16 (12/24 0613) BP: (127-157)/(45-96) 157/56 (12/24 0613) SpO2:  [96 %-100 %] 100 % (12/24 40980613)  Intake/Output from previous day:  Intake/Output Summary (Last 24 hours) at 03/26/16 0932 Last data filed at 03/26/16 0620  Gross per 24 hour  Intake              725 ml  Output              650 ml  Net               75 ml     Labs:  Recent Labs  03/24/16 2057 03/25/16 0610 03/26/16 0645  HGB 9.0* 8.3* 8.1*    Recent Labs  03/25/16 0610 03/25/16 0936 03/26/16 0645  WBC 6.1  --  5.2  RBC 2.59* 2.67* 2.56*  HCT 24.6*  --  24.6*  PLT 110*  --  120*    Recent Labs  03/25/16 0610 03/26/16 0645  NA 134* 136  K 3.9 3.9  CL 104 109  CO2 23 22  BUN 12 14  CREATININE 0.76 0.76  GLUCOSE 112* 94  CALCIUM 7.6* 7.9*     EXAM General - Patient is Appropriate. Diffiuclt to arouse but patient followed commands when asked to dorsiflex and plantar flex her foot. Returned to sleep immediately.  Extremity - Neurologically intact Dorsiflexion/Plantar flexion intact Compartment soft Dressing - dressing C/D/I Motor Function - intact, moving foot and toes well on exam.    Past Medical History:  Diagnosis Date  . AAA (abdominal aortic aneurysm) (HCC)   . Alcoholism (HCC)   . Anxiety   . Hypertension   . Hypothyroidism   . Hypothyroidism   . Neuralgia   . OBS (organic brain syndrome)   . Trigeminal neuralgia   . UTI (lower urinary tract infection)     Assessment/Plan: 2 Days Post-Op Procedure(s)  (LRB): INTRAMEDULLARY (IM) NAIL FEMORAL (Right) Principal Problem:   Closed nondisplaced intertrochanteric fracture of right femur (HCC) Active Problems:   Essential hypertension   Hypothyroidism, acquired   Palliative care encounter   Goals of care, counseling/discussion   Slow transit constipation   History of abdominal aortic aneurysm (AAA)   Acute metabolic encephalopathy   Postoperative anemia due to acute blood loss  Estimated body mass index is 21.58 kg/m as calculated from the following:   Height as of 02/20/12: 5\' 2"  (1.575 m).   Weight as of 02/20/12: 53.5 kg (118 lb). Advance diet Up with therapy  DVT Prophylaxis - Lovenox Weight Bearing As Tolerated   Will have the patient continue to work with therapy, dc when cleared or stable from their standpoint. Plan for placement to SNF when medically stable.   Yolonda KidaJason Patrick Lanice Folden MD Orthopaedic Surgery 03/26/2016, 9:32 AM

## 2016-03-26 NOTE — Discharge Summary (Addendum)
Physician Discharge Summary  [name] [name] GNF:[id number] DOB: [birth year] DOA: 03/23/2016  PCP: [name], MD  Admit date: 03/23/2016 Discharge date: 03/26/2016   NOTE: daughter is angry and does not want her mother to leave the hospital  today. I have spoken with the on call orthopedic surgeon, Dr Aundria Rud who feels she is stable from orthopedic service to be discharged today. I also feel, from a medical point of view she is stable to transition to SNF today. I have explained this to the daughter in detail when I spoke with her yesterday. Today she is yelling and is difficult to talk to.   Admitted From: ALF Disposition:  SNF   Recommendations for Outpatient Follow-up:  1. She is DNR and under hospice palliative care- please have palliative care to continue to follow at facility - if she declines, transition to full hospice at SNF or hospice home  Discharge Condition:  stable   CODE STATUS:  DNR   Diet recommendation:  Regular diet Consultations:  ortho    Discharge Diagnoses:  Principal Problem:   Closed nondisplaced intertrochanteric fracture of right femur (HCC) Active Problems:   Acute metabolic encephalopathy   Essential hypertension   Hypothyroidism, acquired   Palliative care encounter   Goals of care, counseling/discussion   Slow transit constipation   History of abdominal aortic aneurysm (AAA)   Postoperative anemia due to acute blood loss    Subjective: No complaints- pleasantly confused.   Brief Summary: [name] [name] 82 y.o.femalewho presents from ALF with a past medical history significant for HTN, dementia, hypothyroidism,  and AAA s/p endorepair who is mostly wheelchair bound presents with hip pain after an unwittnessed fall. Due to dementia, history difficult to obtain.    Hospital Course:  Principal Problem: R hip fracture-  comminuted intratrochanteric fracture  Intramedullary fixation on 12/22- management per ortho-  - Lovenox 30 mg daily  (pharmacy to dose adjust if needed) - f/u in 2 wks with Dr Linna Caprice   Dementia with delirium - either hospital acquired or due medications needed for surgery/ pain - quite calm today - per daughter, patient has severe short term memory loss - PRN Ativan was being used for severe agitation only (at night) and has worked well- will prescribe  Anemia due to acute blood loss - drop in Hb from 13.8 to 11.9 >> 8.9- no need to transfuse and per palliative care discussion, no aggressive measures to be done- see below - anemia panel consistent with AOCD   HTN: -Continue BB   -Hold ACEi and  HCTZ for now - BP stable and just B blocker   Hypothyroidism: -Continue levothyroxine  Hyponatremia: improved with slow IV hydration- poor appetite  Disposition- palliative care consulted- per daughter, no aggressive measures- DNR- can give IVF and antibiotics as needed   Discharge Instructions   Allergies as of 03/26/2016      Reactions   Dilantin [phenytoin Sodium Extended] Other (See Comments)   On MAR   Penicillins Other (See Comments)   On MAR   Tegretol [carbamazepine] Other (See Comments)   On MAR      Medication List    STOP taking these medications   aspirin 81 MG tablet   hydrochlorothiazide 12.5 MG tablet Commonly known as:  HYDRODIURIL   lisinopril 40 MG tablet Commonly known as:  PRINIVIL,ZESTRIL     TAKE these medications   acetaminophen 325 MG tablet Commonly known as:  TYLENOL Take 650 mg by mouth every 4 (four) hours  as needed for headache.   carvedilol 3.125 MG tablet Commonly known as:  COREG Take 3.125 mg by mouth 2 (two) times daily with a meal.   docusate sodium 100 MG capsule Commonly known as:  COLACE Take 100 mg by mouth at bedtime.   enoxaparin 30 MG/0.3ML injection Commonly known as:  LOVENOX Inject 0.3 mLs (30 mg total) into the skin daily. Start taking on:  03/27/2016   HYDROcodone-acetaminophen 5-325 MG tablet Commonly known as:   NORCO/VICODIN Take 1 tablet by mouth every 6 (six) hours as needed for moderate pain.   levothyroxine 50 MCG tablet Commonly known as:  SYNTHROID, LEVOTHROID Take 50 mcg by mouth daily before breakfast.   LORazepam 1 MG tablet Commonly known as:  ATIVAN Take 1 tablet (1 mg total) by mouth every 8 (eight) hours as needed for anxiety (sever agitation).   magnesium hydroxide 400 MG/5ML suspension Commonly known as:  MILK OF MAGNESIA Take 30 mLs by mouth daily as needed for mild constipation.   Melatonin 3 MG Tabs Take 3 mg by mouth at bedtime.   psyllium 0.52 g capsule Commonly known as:  REGULOID Take 0.52 g by mouth daily.   vitamin B-12 1000 MCG tablet Commonly known as:  CYANOCOBALAMIN Take 1,000 mcg by mouth daily.   Vitamin D (Ergocalciferol) 50000 units Caps capsule Commonly known as:  DRISDOL Take 50,000 Units by mouth every 7 (seven) days.      Follow-up Information    Swinteck, Cloyde ReamsBrian James, MD. Schedule an appointment as soon as possible for a visit in 2 week(s).   Specialty:  Orthopedic Surgery Why:  For wound re-check Contact information: [address] [place] [address] [phone]          Allergies  Allergen Reactions  . Dilantin [Phenytoin Sodium Extended] Other (See Comments)    On MAR  . Penicillins Other (See Comments)    On MAR  . Tegretol [Carbamazepine] Other (See Comments)    On MAR     Procedures/Studies:  R Intramedullary fixation on 12/22-  Dg Chest 2 View  Result Date: 03/23/2016 CLINICAL DATA:  Preop evaluation. EXAM: CHEST  2 VIEW COMPARISON:  Prior radiograph from 08/15/2015. FINDINGS: Patient is rotated. Allowing for rotation, cardiomegaly is stable. Mediastinal silhouette within normal limits. Aortic atherosclerosis noted. Lungs mildly hypoinflated. Mild pulmonary vascular congestion and interstitial prominence without overt pulmonary edema. No pleural effusion. No pneumothorax. No focal infiltrates  identified. Diffuse osteopenia. No acute osseous abnormality. Aortic stent endograft noted. Scoliosis noted. IMPRESSION: 1. Stable cardiomegaly with mild diffuse pulmonary interstitial congestion without overt pulmonary edema. 2. Aortic atherosclerosis. Electronically Signed   By: Rise MuBenjamin  McClintock M.D.   On: 03/23/2016 05:43   Dg Knee 1-2 Views Right  Result Date: 03/23/2016 CLINICAL DATA:  82 year old female with fall and right femoral neck fracture. EXAM: RIGHT KNEE - 1-2 VIEW COMPARISON:  None. FINDINGS: There is no acute fracture or dislocation. The bones are osteopenic. There is mild meniscal chondrocalcinosis. There is no joint effusion. The soft tissues appear unremarkable. Vascular calcification noted. IMPRESSION: No acute fracture or dislocation. Osteopenia. Electronically Signed   By: Elgie CollardArash  Radparvar M.D.   On: 03/23/2016 07:01   Pelvis Portable  Result Date: 03/24/2016 CLINICAL DATA:  ORIF right femur fracture EXAM: PORTABLE PELVIS 1-2 VIEWS COMPARISON:  Intraoperative radiographs dated 03/24/2016 at 1711 hours FINDINGS: Status post IM nail with dynamic hip screw fixation of an intertrochanteric right hip fracture. Fracture fragments are in near anatomic alignment and position.  Visualized bony pelvis appears intact. Bilateral hip joint spaces are symmetric. Vascular calcifications.  Iliac stents. IMPRESSION: Status post ORIF of an intertrochanteric right hip fracture, as above. Electronically Signed   By: Charline Bills M.D.   On: 03/24/2016 18:39   Dg C-arm 1-60 Min  Result Date: 03/24/2016 CLINICAL DATA:  Intramedullary femoral nailing and rodding for fracture EXAM: DG C-ARM 61-120 MIN; OPERATIVE RIGHT HIP WITH PELVIS COMPARISON:  03/23/2016 FINDINGS: Three low resolution spot intraoperative images of the right hip are submitted. Total fluoroscopy time was 10 seconds. The images demonstrate intramedullary rodding of a right intertrochanteric fracture. Anatomic alignment on the  limited views submitted. IMPRESSION: Intraoperative fluoroscopic assistance provided during intramedullary rod fixation of right proximal femoral fracture Electronically Signed   By: Jasmine Pang M.D.   On: 03/24/2016 19:04   Dg Hip Operative Unilat W Or W/o Pelvis Right  Result Date: 03/24/2016 CLINICAL DATA:  Intramedullary femoral nailing and rodding for fracture EXAM: DG C-ARM 61-120 MIN; OPERATIVE RIGHT HIP WITH PELVIS COMPARISON:  03/23/2016 FINDINGS: Three low resolution spot intraoperative images of the right hip are submitted. Total fluoroscopy time was 10 seconds. The images demonstrate intramedullary rodding of a right intertrochanteric fracture. Anatomic alignment on the limited views submitted. IMPRESSION: Intraoperative fluoroscopic assistance provided during intramedullary rod fixation of right proximal femoral fracture Electronically Signed   By: Jasmine Pang M.D.   On: 03/24/2016 19:04   Dg Hip Unilat W Or Wo Pelvis 2-3 Views Right  Result Date: 03/23/2016 CLINICAL DATA:  82 year old female with fall and right hip deformity. EXAM: DG HIP (WITH OR WITHOUT PELVIS) 2-3V RIGHT COMPARISON:  CT of the abdomen pelvis dated 08/15/2015 FINDINGS: There is comminuted appearing intertrochanteric fracture of the right femur with mild valgus angulation. No other acute fracture identified. There is no dislocation. The bones are osteopenic. An aorta bi-iliac endovascular stent graft repair noted. There is moderate stool throughout the colon. The soft tissues are grossly unremarkable. IMPRESSION: Comminuted appearing intratrochanteric fracture of the right femur with mild valgus angulation. No dislocation. Electronically Signed   By: Elgie Collard M.D.   On: 03/23/2016 05:43       Discharge Exam: Vitals:   03/25/16 2010 03/26/16 0613  BP: (!) 127/96 (!) 157/56  Pulse: 88 68  Resp: 16 16  Temp: 99 F (37.2 C) 98.1 F (36.7 C)   Vitals:   03/25/16 0529 03/25/16 1500 03/25/16 2010  03/26/16 0613  BP: (!) 144/58 (!) 128/45 (!) 127/96 (!) 157/56  Pulse:  75 88 68  Resp:  16 16 16   Temp:  98.7 F (37.1 C) 99 F (37.2 C) 98.1 F (36.7 C)  TempSrc:  Axillary Axillary Axillary  SpO2:  96% 99% 100%    General: Pt is alert, awake, confused, not in acute distress Cardiovascular: RRR, S1/S2 +, no rubs, no gallops Respiratory: CTA bilaterally, no wheezing, no rhonchi Abdominal: Soft, NT, ND, bowel sounds + Extremities:  no cyanosis    The results of significant diagnostics from this hospitalization (including imaging, microbiology, ancillary and laboratory) are listed below for reference.     Microbiology: Recent Results (from the past 240 hour(s))  Surgical pcr screen     Status: None   Collection Time: 03/24/16  5:34 AM  Result Value Ref Range Status   MRSA, PCR NEGATIVE NEGATIVE Final   Staphylococcus aureus NEGATIVE NEGATIVE Final    Comment:        The Xpert SA Assay (FDA approved for NASAL specimens in patients  over 80 years of age), is one component of a comprehensive surveillance program.  Test performance has been validated by [facility] for patients greater than or equal to 80 year old. It is not intended to diagnose infection nor to guide or monitor treatment.      Labs: BNP (last 3 results) No results for input(s): BNP in the last 8760 hours. Basic Metabolic Panel:  Recent Labs Lab 03/23/16 0502 03/24/16 2057 03/25/16 0610 03/26/16 0645  NA 131*  --  134* 136  K 4.4  --  3.9 3.9  CL 98*  --  104 109  CO2 23  --  23 22  GLUCOSE 149*  --  112* 94  BUN 16  --  12 14  CREATININE 1.04* 0.88 0.76 0.76  CALCIUM 9.4  --  7.6* 7.9*  MG  --   --  1.7  --   PHOS  --   --  2.8  --    Liver Function Tests:  Recent Labs Lab 03/23/16 0502 03/25/16 0610  AST 25 20  ALT 17 13*  ALKPHOS 70 43  BILITOT 0.8 0.7  PROT 6.3* 4.6*  ALBUMIN 3.7 2.3*    Recent Labs Lab 03/23/16 0502  LIPASE 35   No results for input(s): AMMONIA in  the last 168 hours. CBC:  Recent Labs Lab 03/23/16 0502 03/24/16 2057 03/25/16 0610 03/26/16 0645  WBC 9.6 8.8 6.1 5.2  NEUTROABS 7.7  --  4.7  --   HGB 12.4 9.0* 8.3* 8.1*  HCT 36.1 27.0* 24.6* 24.6*  MCV 92.8 94.4 95.0 96.1  PLT 173 127* 110* 120*   Cardiac Enzymes:  Recent Labs Lab 03/23/16 0502 03/23/16 1224 03/23/16 1839  TROPONINI <0.03 <0.03 <0.03   BNP: Invalid input(s): POCBNP CBG: No results for input(s): GLUCAP in the last 168 hours. D-Dimer No results for input(s): DDIMER in the last 72 hours. Hgb A1c No results for input(s): HGBA1C in the last 72 hours. Lipid Profile No results for input(s): CHOL, HDL, LDLCALC, TRIG, CHOLHDL, LDLDIRECT in the last 72 hours. Thyroid function studies No results for input(s): TSH, T4TOTAL, T3FREE, THYROIDAB in the last 72 hours.  Invalid input(s): FREET3 Anemia work up  Recent Labs  03/25/16 0936  VITAMINB12 1,286*  FOLATE 9.5  FERRITIN 182  TIBC 183*  IRON 18*  RETICCTPCT 2.1    Sepsis Labs Invalid input(s): PROCALCITONIN,  WBC,  LACTICIDVEN Microbiology Recent Results (from the past 240 hour(s))  Surgical pcr screen     Status: None   Collection Time: 03/24/16  5:34 AM  Result Value Ref Range Status   MRSA, PCR NEGATIVE NEGATIVE Final   Staphylococcus aureus NEGATIVE NEGATIVE Final    Comment:        The Xpert SA Assay (FDA approved for NASAL specimens in patients over 80 years of age), is one component of a comprehensive surveillance program.  Test performance has been validated by [facility] for patients greater than or equal to 80 year old. It is not intended to diagnose infection nor to guide or monitor treatment.      Time coordinating discharge: Over 30 minutes  SIGNED:   Calvert CantorIZWAN,Kimber Fritts, MD  [facility] 03/26/2016, 11:23 AM Pager   If 7PM-7AM, please contact night-coverage [email or web address] Password [email or web address]

## 2016-03-28 ENCOUNTER — Other Ambulatory Visit: Payer: Self-pay

## 2016-03-28 ENCOUNTER — Encounter (HOSPITAL_COMMUNITY): Payer: Self-pay | Admitting: Orthopedic Surgery

## 2016-03-28 MED ORDER — HYDROCODONE-ACETAMINOPHEN 5-325 MG PO TABS: 1.0000 | ORAL_TABLET | Freq: Four times a day (QID) | ORAL | 0 refills | Status: AC | PRN

## 2016-03-28 MED ORDER — LORAZEPAM 1 MG PO TABS: 1.0000 mg | ORAL_TABLET | Freq: Three times a day (TID) | ORAL | 0 refills | Status: AC | PRN

## 2016-03-28 NOTE — Telephone Encounter (Signed)
Prescription request was received from:  [facility] [address] [place] [address]  Phone: [phone]  Fax: [phone]  

## 2016-03-28 NOTE — Telephone Encounter (Signed)
Prescription request was received from:  [facility] [address] [place]  [address]  Phone: [phone]  Fax: [phone]  

## 2016-03-29 NOTE — Anesthesia Postprocedure Evaluation (Signed)
Anesthesia Post Note  Patient: [name] [name]  Procedure(s) Performed: Procedure(s) (LRB): INTRAMEDULLARY (IM) NAIL FEMORAL (Right)  Patient location during evaluation: PACU Anesthesia Type: General Level of consciousness: confused Pain management: pain level controlled Vital Signs Assessment: post-procedure vital signs reviewed and stable Respiratory status: spontaneous breathing, nonlabored ventilation, respiratory function stable and patient connected to nasal cannula oxygen Cardiovascular status: blood pressure returned to baseline and stable Postop Assessment: no signs of nausea or vomiting Anesthetic complications: no       Last Vitals:  Vitals:   03/25/16 2010 03/26/16 0613  BP: (!) 127/96 (!) 157/56  Pulse: 88 68  Resp: 16 16  Temp: 37.2 C 36.7 C    Last Pain:  Vitals:   03/26/16 0613  TempSrc: Axillary  PainSc:                  [name] S

## 2016-03-30 ENCOUNTER — Encounter: Payer: Self-pay | Admitting: Internal Medicine

## 2016-03-30 ENCOUNTER — Non-Acute Institutional Stay (SKILLED_NURSING_FACILITY): Payer: Medicare Other | Admitting: Internal Medicine

## 2016-03-30 DIAGNOSIS — R2681 Unsteadiness on feet: Secondary | ICD-10-CM

## 2016-03-30 DIAGNOSIS — E871 Hypo-osmolality and hyponatremia: Secondary | ICD-10-CM

## 2016-03-30 DIAGNOSIS — B370 Candidal stomatitis: Secondary | ICD-10-CM

## 2016-03-30 DIAGNOSIS — S72144S Nondisplaced intertrochanteric fracture of right femur, sequela: Secondary | ICD-10-CM

## 2016-03-30 DIAGNOSIS — E039 Hypothyroidism, unspecified: Secondary | ICD-10-CM

## 2016-03-30 DIAGNOSIS — T148XXA Other injury of unspecified body region, initial encounter: Secondary | ICD-10-CM

## 2016-03-30 DIAGNOSIS — D62 Acute posthemorrhagic anemia: Secondary | ICD-10-CM

## 2016-03-30 DIAGNOSIS — K5901 Slow transit constipation: Secondary | ICD-10-CM | POA: Diagnosis not present

## 2016-03-30 DIAGNOSIS — I10 Essential (primary) hypertension: Secondary | ICD-10-CM | POA: Diagnosis not present

## 2016-03-30 DIAGNOSIS — F0390 Unspecified dementia without behavioral disturbance: Secondary | ICD-10-CM

## 2016-03-30 DIAGNOSIS — Z960 Presence of urogenital implants: Secondary | ICD-10-CM

## 2016-03-30 DIAGNOSIS — Z978 Presence of other specified devices: Secondary | ICD-10-CM

## 2016-03-30 NOTE — Progress Notes (Signed)
LOCATION: [facility]  PCP: [name], MD   Code Status: DNR  Goals of care: Advanced Directive information Advanced Directives 03/30/2016  Does Patient Have a Medical Advance Directive? Yes  Type of Advance Directive Out of facility DNR (pink MOST or yellow form)  Does patient want to make changes to medical advance directive? No - Patient declined  Would patient like information on creating a medical advance directive? -       Extended Emergency Contact Information Primary Emergency Contact: Minami-Rogers,Judy          [place] [address] [address] [place] Home Phone: [phone] Mobile Phone: [phone] Relation: Daughter   Allergies  Allergen Reactions  . Dilantin [Phenytoin Sodium Extended] Other (See Comments)    On MAR  . Penicillins Other (See Comments)    On MAR  . Tegretol [Carbamazepine] Other (See Comments)    On [facility]    Chief Complaint  Patient presents with  . New Admit To SNF    New Admission Visit      HPI:  Patient is a 82 y.o. female seen today for short term rehabilitation post hospital admission from 03/23/2016-03/26/2016 post fall with intertrochanteric right femur closed fracture. She underwent intramedullary nail on 03/24/2016. She was noted to have acute blood loss anemia but did not require any transfusion. She also had hyponatremia and received IV fluids. She has medical history of hypertension, hypothyroidism, dementia and abdominal aortic aneurysm status post endorepair among others and was residing [facility] to [facility] admission. She is seen in her room today with her daughter at bedside. She participates minimally in history of present illness and review of system.  Review of Systems: from daughter and nursing Constitutional: Negative for fever.  HENT: Negative for headache, nasal discharge. Eyes: Negative for double vision and discharge.  Respiratory: Negative for cough, shortness of breath    Cardiovascular: Negative for chest pain  Gastrointestinal: Negative for heartburn, nausea, vomiting, abdominal pain.needs assistance with feeding.  Genitourinary: Has indwelling Foley catheter Musculoskeletal: Negative for fall at the facility    Past Medical History:  Diagnosis Date  . AAA (abdominal aortic aneurysm) (HCC)   . Alcoholism (HCC)   . Anxiety   . Hypertension   . Hypothyroidism   . Hypothyroidism   . Neuralgia   . OBS (organic brain syndrome)   . Trigeminal neuralgia   . UTI (lower urinary tract infection)    Past Surgical History:  Procedure Laterality Date  . ABDOMINAL ANGIOGRAM    . ABDOMINAL AORTIC ANEURYSM REPAIR  12/14/2000  . ABDOMINAL HYSTERECTOMY    . BRAIN SURGERY     janetta procedure  . CATARACT EXTRACTION    . COLONOSCOPY    . FEMUR IM NAIL Right 03/24/2016   Procedure: INTRAMEDULLARY (IM) NAIL FEMORAL;  Surgeon: Samson Frederic, MD;  Location: [facility] OR;  Service: Orthopedics;  Laterality: Right;   Social History:   reports that she quit smoking about 54 years ago. Her smoking use included Cigarettes. She has never used smokeless tobacco. She reports that she drinks about 4.2 oz of alcohol per week . She reports that she does not use drugs.  Family History  Problem Relation Age of Onset  . Other Brother     coronary artery disaease    Medications: Allergies as of 03/30/2016      Reactions   Dilantin [phenytoin Sodium Extended] Other (See Comments)   On MAR   Penicillins Other (See Comments)   On MAR  Tegretol [carbamazepine] Other (See Comments)   On MAR      Medication List       Accurate as of 03/30/16  2:48 PM. Always use your most recent med list.          acetaminophen 325 MG tablet Commonly known as:  TYLENOL Take 650 mg by mouth every 4 (four) hours as needed for headache.   carvedilol 3.125 MG tablet Commonly known as:  COREG Take 3.125 mg by mouth 2 (two) times daily with a meal.   docusate sodium 100 MG  capsule Commonly known as:  COLACE Take 100 mg by mouth at bedtime.   enoxaparin 30 MG/0.3ML injection Commonly known as:  LOVENOX Inject 0.3 mLs (30 mg total) into the skin daily.   HYDROcodone-acetaminophen 5-325 MG tablet Commonly known as:  NORCO/VICODIN Take 1 tablet by mouth every 6 (six) hours as needed for moderate pain.   levothyroxine 50 MCG tablet Commonly known as:  SYNTHROID, LEVOTHROID Take 50 mcg by mouth daily before breakfast.   LORazepam 1 MG tablet Commonly known as:  ATIVAN Take 1 tablet (1 mg total) by mouth every 8 (eight) hours as needed for anxiety (sever agitation).   magnesium hydroxide 400 MG/5ML suspension Commonly known as:  MILK OF MAGNESIA Take 30 mLs by mouth daily as needed for mild constipation.   Melatonin 3 MG Tabs Take 3 mg by mouth at bedtime.   nystatin 100000 UNIT/ML suspension Commonly known as:  MYCOSTATIN Take 5 mLs by mouth every 8 (eight) hours. Stop date 04/09/15   psyllium 0.52 g capsule Commonly known as:  REGULOID Take 0.52 g by mouth daily.   vitamin B-12 1000 MCG tablet Commonly known as:  CYANOCOBALAMIN Take 1,000 mcg by mouth daily.   Vitamin D (Ergocalciferol) 50000 units Caps capsule Commonly known as:  DRISDOL Take 50,000 Units by mouth every 7 (seven) days.       Immunizations: Immunization History  Administered Date(s) Administered  . Influenza-Unspecified 01/22/2016     Physical Exam:  Vitals:   03/30/16 1443  BP: 134/74  Pulse: 85  Resp: 16  Temp: 97.5 F (36.4 C)  TempSrc: Oral  SpO2: 97%   There is no height or weight on file to calculate BMI.  General- elderly female, Frail and thin built, in no acute distress Head- normocephalic, atraumatic Throat- moist mucus membrane, oral thrush present Eyes- PERRLA, EOMI, no pallor, no icterus Neck- no cervical lymphadenopathy Cardiovascular- normal s1,s2, no murmur Respiratory- bilateral clear to auscultation Abdomen- bowel sounds present,  soft, non tender, Foley catheter in place draining clear urine Musculoskeletal- able to move all 4 extremities, generalized weakness, limited range of motion to her right leg  Neurological- alert and oriented to self only  Skin- warm and dry, deep tissue injury to sacrum, 3 surgical incisions to right hip, surgical site healing well   Labs reviewed: Basic Metabolic Panel:  Recent Labs  16/01/9611/21/17 0502 03/24/16 2057 03/25/16 0610 03/26/16 0645  NA 131*  --  134* 136  K 4.4  --  3.9 3.9  CL 98*  --  104 109  CO2 23  --  23 22  GLUCOSE 149*  --  112* 94  BUN 16  --  12 14  CREATININE 1.04* 0.88 0.76 0.76  CALCIUM 9.4  --  7.6* 7.9*  MG  --   --  1.7  --   PHOS  --   --  2.8  --    Liver Function Tests:  Recent Labs  03/23/16 0502 03/25/16 0610  AST 25 20  ALT 17 13*  ALKPHOS 70 43  BILITOT 0.8 0.7  PROT 6.3* 4.6*  ALBUMIN 3.7 2.3*    Recent Labs  03/23/16 0502  LIPASE 35   No results for input(s): AMMONIA in the last 8760 hours. CBC:  Recent Labs  08/15/15 0651 03/23/16 0502 03/24/16 2057 03/25/16 0610 03/26/16 0645  WBC 8.2 9.6 8.8 6.1 5.2  NEUTROABS 6.1 7.7  --  4.7  --   HGB 13.1 12.4 9.0* 8.3* 8.1*  HCT 38.6 36.1 27.0* 24.6* 24.6*  MCV 94.8 92.8 94.4 95.0 96.1  PLT 156 173 127* 110* 120*   Cardiac Enzymes:  Recent Labs  03/23/16 0502 03/23/16 1224 03/23/16 1839  TROPONINI <0.03 <0.03 <0.03   BNP: Invalid input(s): POCBNP CBG: No results for input(s): GLUCAP in the last 8760 hours.  Radiological Exams: Dg Chest 2 View  Result Date: 03/23/2016 CLINICAL DATA:  Preop evaluation. EXAM: CHEST  2 VIEW COMPARISON:  Prior radiograph from 08/15/2015. FINDINGS: Patient is rotated. Allowing for rotation, cardiomegaly is stable. Mediastinal silhouette within normal limits. Aortic atherosclerosis noted. Lungs mildly hypoinflated. Mild pulmonary vascular congestion and interstitial prominence without overt pulmonary edema. No pleural effusion. No  pneumothorax. No focal infiltrates identified. Diffuse osteopenia. No acute osseous abnormality. Aortic stent endograft noted. Scoliosis noted. IMPRESSION: 1. Stable cardiomegaly with mild diffuse pulmonary interstitial congestion without overt pulmonary edema. 2. Aortic atherosclerosis. Electronically Signed   By: Rise MuBenjamin  McClintock M.D.   On: 03/23/2016 05:43   Dg Knee 1-2 Views Right  Result Date: 03/23/2016 CLINICAL DATA:  82 year old female with fall and right femoral neck fracture. EXAM: RIGHT KNEE - 1-2 VIEW COMPARISON:  None. FINDINGS: There is no acute fracture or dislocation. The bones are osteopenic. There is mild meniscal chondrocalcinosis. There is no joint effusion. The soft tissues appear unremarkable. Vascular calcification noted. IMPRESSION: No acute fracture or dislocation. Osteopenia. Electronically Signed   By: Elgie CollardArash  Radparvar M.D.   On: 03/23/2016 07:01   Pelvis Portable  Result Date: 03/24/2016 CLINICAL DATA:  ORIF right femur fracture EXAM: PORTABLE PELVIS 1-2 VIEWS COMPARISON:  Intraoperative radiographs dated 03/24/2016 at 1711 hours FINDINGS: Status post IM nail with dynamic hip screw fixation of an intertrochanteric right hip fracture. Fracture fragments are in near anatomic alignment and position. Visualized bony pelvis appears intact. Bilateral hip joint spaces are symmetric. Vascular calcifications.  Iliac stents. IMPRESSION: Status post ORIF of an intertrochanteric right hip fracture, as above. Electronically Signed   By: Charline BillsSriyesh  Krishnan M.D.   On: 03/24/2016 18:39   Dg C-arm 1-60 Min  Result Date: 03/24/2016 CLINICAL DATA:  Intramedullary femoral nailing and rodding for fracture EXAM: DG C-ARM 61-120 MIN; OPERATIVE RIGHT HIP WITH PELVIS COMPARISON:  03/23/2016 FINDINGS: Three low resolution spot intraoperative images of the right hip are submitted. Total fluoroscopy time was 10 seconds. The images demonstrate intramedullary rodding of a right intertrochanteric  fracture. Anatomic alignment on the limited views submitted. IMPRESSION: Intraoperative fluoroscopic assistance provided during intramedullary rod fixation of right proximal femoral fracture Electronically Signed   By: Jasmine PangKim  Fujinaga M.D.   On: 03/24/2016 19:04   Dg Hip Operative Unilat W Or W/o Pelvis Right  Result Date: 03/24/2016 CLINICAL DATA:  Intramedullary femoral nailing and rodding for fracture EXAM: DG C-ARM 61-120 MIN; OPERATIVE RIGHT HIP WITH PELVIS COMPARISON:  03/23/2016 FINDINGS: Three low resolution spot intraoperative images of the right hip are submitted. Total fluoroscopy time was 10 seconds. The images demonstrate  intramedullary rodding of a right intertrochanteric fracture. Anatomic alignment on the limited views submitted. IMPRESSION: Intraoperative fluoroscopic assistance provided during intramedullary rod fixation of right proximal femoral fracture Electronically Signed   By: Jasmine Pang M.D.   On: 03/24/2016 19:04   Dg Hip Unilat W Or Wo Pelvis 2-3 Views Right  Result Date: 03/23/2016 CLINICAL DATA:  82 year old female with fall and right hip deformity. EXAM: DG HIP (WITH OR WITHOUT PELVIS) 2-3V RIGHT COMPARISON:  CT of the abdomen pelvis dated 08/15/2015 FINDINGS: There is comminuted appearing intertrochanteric fracture of the right femur with mild valgus angulation. No other acute fracture identified. There is no dislocation. The bones are osteopenic. An aorta bi-iliac endovascular stent graft repair noted. There is moderate stool throughout the colon. The soft tissues are grossly unremarkable. IMPRESSION: Comminuted appearing intratrochanteric fracture of the right femur with mild valgus angulation. No dislocation. Electronically Signed   By: Elgie Collard M.D.   On: 03/23/2016 05:43    Assessment/Plan  Unsteady gait Status post right hip fracture. Will need for patient to work with physical therapy and occupational therapy as tolerated to help regain her strength and  balance. Patient was being followed by palliative care team at the assisted living facility per discharge summary. Will get palliative care consult to focus in terms of comfort care.  Right intertrochanteric closed fracture Status post IM nail on 03/24/2016. Has follow-up with orthopedics in 2 weeks. Continue Lovenox daily for DVT prophylaxis. Continue Norco 5-3 25 mg every 6 hours as needed for severe pain. Will place her on Tylenol 650 mg 3 times a day for now to help with pain. Will also have her to work with physical therapy and occupational therapy as tolerated to help restore her strength and balance.  Acute blood loss anemia Post surgery. Monitor CBC.  Hyponatremia Status post IV fluids in the hospital. Monitor BMP.  Oral thrush To provide oral hygiene every shift. Will have her on nystatin swish and spit 100,000 unit 10 mL twice a day for now for 1 week and monitor.  Dementia without behavioral disturbance Patient is pleasantly confused this visit. Provide supportive care. To provide complete assistance with feeding. Fall precautions. Aspiration precautions. Currently on mechanical soft diet. Get speech therapy to evaluate.  Status post Foley catheter Has Foley catheter in place for indication. Will do a voiding trial in a.m. and place another Foley catheter if unable to void and get urology consult. Hydration to be maintained.  Deep tissue injury to sacrum To provide preventative dressing to this area. A distribution mattress to be provided. Pressure ulcer prophylaxis to be taken.  Constipation Continue Colace 100 mg daily at bedtime  Hypothyroidism Continue current regimen of levothyroxine  Hypertension Monitor blood pressure reading. Continue Coreg 3.125 mg twice a day. Her ACE inhibitor and hydrochlorothiazide has been held since this admission to the hospital.    Goals of care: short term rehabilitation but possible long-term care   Labs/tests ordered: CBC,  BMP  Family/ staff Communication: reviewed care plan with patient's daughter and nursing supervisor    Oneal Grout, MD Internal Medicine [facility] [address] [place], [place] [address] Cell Phone (Monday-Friday 8 am - 5 pm): [phone] On Call: [phone] and follow prompts after 5 pm and on weekends Office Phone: [phone] Office Fax: [phone]

## 2016-04-02 LAB — BASIC METABOLIC PANEL
BUN: 26 mg/dL — AB (ref 4–21)
CREATININE: 0.7 mg/dL (ref 0.5–1.1)
Glucose: 140 mg/dL
Potassium: 3.3 mmol/L — AB (ref 3.4–5.3)
Sodium: 135 mmol/L — AB (ref 137–147)

## 2016-04-02 LAB — CBC AND DIFFERENTIAL
HCT: 29 % — AB (ref 36–46)
HEMOGLOBIN: 9.4 g/dL — AB (ref 12.0–16.0)
Neutrophils Absolute: 4992 /uL
Platelets: 162 10*3/uL (ref 150–399)
WBC: 6.7 10^3/mL

## 2016-04-04 ENCOUNTER — Non-Acute Institutional Stay (SKILLED_NURSING_FACILITY): Payer: Medicare Other | Admitting: Adult Health

## 2016-04-04 ENCOUNTER — Encounter: Payer: Self-pay | Admitting: Adult Health

## 2016-04-04 DIAGNOSIS — N3000 Acute cystitis without hematuria: Secondary | ICD-10-CM

## 2016-04-04 DIAGNOSIS — E876 Hypokalemia: Secondary | ICD-10-CM

## 2016-04-04 DIAGNOSIS — D62 Acute posthemorrhagic anemia: Secondary | ICD-10-CM | POA: Diagnosis not present

## 2016-04-04 DIAGNOSIS — R339 Retention of urine, unspecified: Secondary | ICD-10-CM

## 2016-04-04 NOTE — Progress Notes (Signed)
DATE:  04/04/2016   MRN:  [id number]  BIRTHDAY: 17-Jan-1917  Facility:  Nursing Home Location:  [facility]  Nursing Home Room Number: 808-P  LEVEL OF CARE:  [phone])  Contact Information    Name Relation Home Work Mobile   Demir-Rogers,Judy Daughter [phone]  [phone]   Dejane, Scheibe [phone]  [phone]   Jonaya, Freshour [phone]  [phone]       Code Status History    Date Active Date Inactive Code Status Order ID Comments User Context   03/23/2016  9:32 AM 03/26/2016  4:49 PM DNR [id number]  Alberteen Sam, MD ED    Questions for Most Recent Historical Code Status (Order [id number])    Question Answer Comment   In the event of cardiac or respiratory ARREST Do not call a "code blue"    In the event of cardiac or respiratory ARREST Do not perform Intubation, CPR, defibrillation or ACLS    In the event of cardiac or respiratory ARREST Use medication by any route, position, wound care, and other measures to relive pain and suffering. May use oxygen, suction and manual treatment of airway obstruction as needed for comfort.         Advance Directive Documentation   Flowsheet Row Most Recent Value  Type of Advance Directive  Out of facility DNR (pink MOST or yellow form)  Pre-existing out of facility DNR order (yellow form or pink MOST form)  No data  "MOST" Form in Place?  No data       Chief Complaint  Patient presents with  . Acute Visit    Hypokalemia, iron deficiency anemia    HISTORY OF PRESENT ILLNESS:  This is a 83-YO female who has a foley catheter due to urinary retention. Foley catheter was discontinued over and a voiding trial was done but failed so foley catheter was re-inserted. Patient complaints of wanting to void several times this morning and was brought to the bathroom. Daughter was at bedside this morning. Urine sample was sent yesterday and showed > or = 60 Leukocyte esterase, the area many and  nitrite negative. Latest hemoglobin is 9.4 and K 3.3.   She was admitted to [id number] on 03/26/16 from [facility] admission 03/23/16 through 03/26/16. She had a fall and sustained intertrochanteric right femur post fracture. She had intramedullary nail on 03/24/16. She had hyponatremia and received IV fluids. She is a resident at [facility]. She is currently having a short-term rehabilitation.  PAST MEDICAL HISTORY:  Past Medical History:  Diagnosis Date  . AAA (abdominal aortic aneurysm) (HCC)   . Alcoholism (HCC)   . Anxiety   . Hypertension   . Hypothyroidism   . Neuralgia   . OBS (organic brain syndrome)   . Trigeminal neuralgia   . UTI (lower urinary tract infection)      CURRENT MEDICATIONS: Reviewed  Patient's Medications  New Prescriptions   No medications on file  Previous Medications   ACETAMINOPHEN (TYLENOL) 325 MG TABLET    Take 650 mg by mouth every 8 (eight) hours.   AMINO ACIDS-PROTEIN HYDROLYS (FEEDING SUPPLEMENT, PRO-STAT SUGAR FREE 64,) LIQD    Take 30 mLs by mouth daily.   CARVEDILOL (COREG) 3.125 MG TABLET    Take 3.125 mg by mouth 2 (two) times daily with a meal.   DOCUSATE SODIUM (COLACE) 100 MG CAPSULE    Take 100 mg by mouth at bedtime.   ENOXAPARIN (LOVENOX) 30 MG/0.3ML INJECTION  Inject 0.3 mLs (30 mg total) into the skin daily.   FERROUS SULFATE 325 (65 FE) MG TABLET    Take 325 mg by mouth daily with breakfast.   HYDROCODONE-ACETAMINOPHEN (NORCO/VICODIN) 5-325 MG TABLET    Take 1 tablet by mouth every 6 (six) hours as needed for moderate pain.   LEVOTHYROXINE (SYNTHROID, LEVOTHROID) 50 MCG TABLET    Take 50 mcg by mouth daily before breakfast.   LORAZEPAM (ATIVAN) 1 MG TABLET    Take 1 tablet (1 mg total) by mouth every 8 (eight) hours as needed for anxiety (sever agitation).   MAGNESIUM HYDROXIDE (MILK OF MAGNESIA) 400 MG/5ML SUSPENSION    Take 30 mLs by mouth daily as needed for mild constipation.   MELATONIN 3 MG TABS    Take 3 mg  by mouth at bedtime.   NITROFURANTOIN, MACROCRYSTAL-MONOHYDRATE, (MACROBID) 100 MG CAPSULE    Take 100 mg by mouth 2 (two) times daily. x7 days   NUTRITIONAL SUPPLEMENTS (NUTRITIONAL SUPPLEMENT PO)    Take 1 each by mouth 2 (two) times daily. Magic Cup   NYSTATIN (MYCOSTATIN) 100000 UNIT/ML SUSPENSION    Take 5 mLs by mouth every 8 (eight) hours. Stop date 04/09/15    POTASSIUM CHLORIDE SA (K-DUR,KLOR-CON) 20 MEQ TABLET    Take 40 mEq by mouth once. Give two tablets to = 40 mEq po x1   PSYLLIUM (REGULOID) 0.52 G CAPSULE    Take 0.52 g by mouth daily.   SACCHAROMYCES BOULARDII (FLORASTOR) 250 MG CAPSULE    Take 250 mg by mouth 2 (two) times daily. x10 days   VITAMIN B-12 (CYANOCOBALAMIN) 1000 MCG TABLET    Take 1,000 mcg by mouth daily.   VITAMIN D, ERGOCALCIFEROL, (DRISDOL) 50000 UNITS CAPS CAPSULE    Take 50,000 Units by mouth every 7 (seven) days.  Modified Medications   No medications on file  Discontinued Medications   ACETAMINOPHEN (TYLENOL) 325 MG TABLET    Take 650 mg by mouth every 4 (four) hours as needed for headache.     Allergies  Allergen Reactions  . Dilantin [Phenytoin Sodium Extended] Other (See Comments)    On MAR  . Penicillins Other (See Comments)    On MAR  . Tegretol [Carbamazepine] Other (See Comments)    On MAR     REVIEW OF SYSTEMS:  GENERAL: no change in appetite, no fatigue, no weight changes, no fever, chills or weakness EYES: Denies change in vision, dry eyes, eye pain, itching or discharge EARS: Denies change in hearing, ringing in ears, or earache NOSE: Denies nasal congestion or epistaxis MOUTH and THROAT: Denies oral discomfort, gingival pain or bleeding, pain from teeth or hoarseness   RESPIRATORY: no cough, SOB, DOE, wheezing, hemoptysis CARDIAC: no chest pain, edema or palpitations GI: no abdominal pain, diarrhea, constipation, heart burn, nausea or vomiting GU: + dysuria PSYCHIATRIC: Denies feeling of depression or anxiety. No report of  hallucinations, insomnia, paranoia, or agitation    PHYSICAL EXAMINATION  GENERAL APPEARANCE: Well nourished. In no acute distress. Normal body habitus SKIN:  Right hip surgical incisions are healed HEAD: Normal in size and contour. No evidence of trauma EYES: Lids open and close normally. No blepharitis, entropion or ectropion. PERRL. Conjunctivae are clear and sclerae are white. Lenses are without opacity EARS: Pinnae are normal. Patient hears normal voice tunes of the examiner MOUTH and THROAT: Lips are without lesions. Oral mucosa is moist and without lesions. Tongue is normal in shape, size, and color and without lesions NECK:  supple, trachea midline, no neck masses, no thyroid tenderness, no thyromegaly LYMPHATICS: no LAN in the neck, no supraclavicular LAN RESPIRATORY: breathing is even & unlabored, BS CTAB CARDIAC: RRR, + murmur,no extra heart sounds, no edema GI: abdomen soft, normal BS, no masses, no tenderness, no hepatomegaly, no splenomegaly GU:  + Foley catheter draining to urine bag EXTREMITIES:  Able to move 4 extremities PSYCHIATRIC: Alert to person, disoriented to time and place. Affect and behavior are appropriate  LABS/RADIOLOGY: Labs reviewed: Basic Metabolic Panel:  Recent Labs  16/10/96 0502 03/24/16 2057 03/25/16 0610 03/26/16 0645  NA 131*  --  134* 136  K 4.4  --  3.9 3.9  CL 98*  --  104 109  CO2 23  --  23 22  GLUCOSE 149*  --  112* 94  BUN 16  --  12 14  CREATININE 1.04* 0.88 0.76 0.76  CALCIUM 9.4  --  7.6* 7.9*  MG  --   --  1.7  --   PHOS  --   --  2.8  --    Liver Function Tests:  Recent Labs  03/23/16 0502 03/25/16 0610  AST 25 20  ALT 17 13*  ALKPHOS 70 43  BILITOT 0.8 0.7  PROT 6.3* 4.6*  ALBUMIN 3.7 2.3*    Recent Labs  03/23/16 0502  LIPASE 35   CBC:  Recent Labs  08/15/15 0651 03/23/16 0502 03/24/16 2057 03/25/16 0610 03/26/16 0645  WBC 8.2 9.6 8.8 6.1 5.2  NEUTROABS 6.1 7.7  --  4.7  --   HGB 13.1 12.4  9.0* 8.3* 8.1*  HCT 38.6 36.1 27.0* 24.6* 24.6*  MCV 94.8 92.8 94.4 95.0 96.1  PLT 156 173 127* 110* 120*   Cardiac Enzymes:  Recent Labs  03/23/16 0502 03/23/16 1224 03/23/16 1839  TROPONINI <0.03 <0.03 <0.03    Dg Chest 2 View  Result Date: 03/23/2016 CLINICAL DATA:  Preop evaluation. EXAM: CHEST  2 VIEW COMPARISON:  Prior radiograph from 08/15/2015. FINDINGS: Patient is rotated. Allowing for rotation, cardiomegaly is stable. Mediastinal silhouette within normal limits. Aortic atherosclerosis noted. Lungs mildly hypoinflated. Mild pulmonary vascular congestion and interstitial prominence without overt pulmonary edema. No pleural effusion. No pneumothorax. No focal infiltrates identified. Diffuse osteopenia. No acute osseous abnormality. Aortic stent endograft noted. Scoliosis noted. IMPRESSION: 1. Stable cardiomegaly with mild diffuse pulmonary interstitial congestion without overt pulmonary edema. 2. Aortic atherosclerosis. Electronically Signed   By: Rise Mu M.D.   On: 03/23/2016 05:43   Dg Knee 1-2 Views Right  Result Date: 03/23/2016 CLINICAL DATA:  83 year old female with fall and right femoral neck fracture. EXAM: RIGHT KNEE - 1-2 VIEW COMPARISON:  None. FINDINGS: There is no acute fracture or dislocation. The bones are osteopenic. There is mild meniscal chondrocalcinosis. There is no joint effusion. The soft tissues appear unremarkable. Vascular calcification noted. IMPRESSION: No acute fracture or dislocation. Osteopenia. Electronically Signed   By: Elgie Collard M.D.   On: 03/23/2016 07:01   Pelvis Portable  Result Date: 03/24/2016 CLINICAL DATA:  ORIF right femur fracture EXAM: PORTABLE PELVIS 1-2 VIEWS COMPARISON:  Intraoperative radiographs dated 03/24/2016 at 1711 hours FINDINGS: Status post IM nail with dynamic hip screw fixation of an intertrochanteric right hip fracture. Fracture fragments are in near anatomic alignment and position. Visualized bony  pelvis appears intact. Bilateral hip joint spaces are symmetric. Vascular calcifications.  Iliac stents. IMPRESSION: Status post ORIF of an intertrochanteric right hip fracture, as above. Electronically Signed   By: Lurlean Horns  Rito EhrlichKrishnan M.D.   On: 03/24/2016 18:39   Dg C-arm 1-60 Min  Result Date: 03/24/2016 CLINICAL DATA:  Intramedullary femoral nailing and rodding for fracture EXAM: DG C-ARM 61-120 MIN; OPERATIVE RIGHT HIP WITH PELVIS COMPARISON:  03/23/2016 FINDINGS: Three low resolution spot intraoperative images of the right hip are submitted. Total fluoroscopy time was 10 seconds. The images demonstrate intramedullary rodding of a right intertrochanteric fracture. Anatomic alignment on the limited views submitted. IMPRESSION: Intraoperative fluoroscopic assistance provided during intramedullary rod fixation of right proximal femoral fracture Electronically Signed   By: Jasmine PangKim  Fujinaga M.D.   On: 03/24/2016 19:04   Dg Hip Operative Unilat W Or W/o Pelvis Right  Result Date: 03/24/2016 CLINICAL DATA:  Intramedullary femoral nailing and rodding for fracture EXAM: DG C-ARM 61-120 MIN; OPERATIVE RIGHT HIP WITH PELVIS COMPARISON:  03/23/2016 FINDINGS: Three low resolution spot intraoperative images of the right hip are submitted. Total fluoroscopy time was 10 seconds. The images demonstrate intramedullary rodding of a right intertrochanteric fracture. Anatomic alignment on the limited views submitted. IMPRESSION: Intraoperative fluoroscopic assistance provided during intramedullary rod fixation of right proximal femoral fracture Electronically Signed   By: Jasmine PangKim  Fujinaga M.D.   On: 03/24/2016 19:04   Dg Hip Unilat W Or Wo Pelvis 2-3 Views Right  Result Date: 03/23/2016 CLINICAL DATA:  83 year old female with fall and right hip deformity. EXAM: DG HIP (WITH OR WITHOUT PELVIS) 2-3V RIGHT COMPARISON:  CT of the abdomen pelvis dated 08/15/2015 FINDINGS: There is comminuted appearing intertrochanteric fracture  of the right femur with mild valgus angulation. No other acute fracture identified. There is no dislocation. The bones are osteopenic. An aorta bi-iliac endovascular stent graft repair noted. There is moderate stool throughout the colon. The soft tissues are grossly unremarkable. IMPRESSION: Comminuted appearing intratrochanteric fracture of the right femur with mild valgus angulation. No dislocation. Electronically Signed   By: Elgie CollardArash  Radparvar M.D.   On: 03/23/2016 05:43    ASSESSMENT/PLAN:  UTI - will start on antibiotic due to dysuria, bacteria and has leukocyte esterase and urinary retention, start Macrobid 100 mg by mouth twice a day 7 days and Florastor 250 mg 1 capsule by mouth twice a day 10 days  Urinary retention - has failed voiding trial; follow-up with urology  Anemia, acute blood loss - start ferrous sulfate 325 mg 1 tab by mouth daily; CBC in 1 week Lab Results  Component Value Date   HGB 9.4 (A) 04/02/2016   Hypokalemia -  Give KCL ER 20 meq 2 tabs = 40 meq by mouth 1 Lab Results  Component Value Date   K 3.3 (A) 04/02/2016       Kelsey Durflinger C. Medina-Vargas - NP [facility] [phone]

## 2016-05-04 DEATH — deceased
